# Patient Record
Sex: Male | Born: 1965 | Race: White
Health system: Northeastern US, Academic
[De-identification: ages and names within clinical notes are randomized; demographics above are authoritative.]

## PROBLEM LIST (undated history)

## (undated) DIAGNOSIS — K439 Ventral hernia without obstruction or gangrene: Secondary | ICD-10-CM

## (undated) DIAGNOSIS — A281 Cat-scratch disease: Secondary | ICD-10-CM

## (undated) DIAGNOSIS — F40240 Claustrophobia: Secondary | ICD-10-CM

## (undated) DIAGNOSIS — Z789 Other specified health status: Secondary | ICD-10-CM

## (undated) DIAGNOSIS — T4145XA Adverse effect of unspecified anesthetic, initial encounter: Secondary | ICD-10-CM

## (undated) DIAGNOSIS — T8859XA Other complications of anesthesia, initial encounter: Secondary | ICD-10-CM

## (undated) DIAGNOSIS — Z8489 Family history of other specified conditions: Secondary | ICD-10-CM

## (undated) HISTORY — PX: CHOLECYSTECTOMY: SHX55

## (undated) HISTORY — PX: LAPAROSCOPIC CHOLECYSTECTOMY: SUR755

## (undated) HISTORY — PX: NECK SURGERY: SHX720

## (undated) HISTORY — PX: HERNIA REPAIR: SHX51

## (undated) HISTORY — PX: CHOLECYSTECTOMY, LAPAROSCOPIC: SHX56

## (undated) HISTORY — PX: THYROID SURGERY: SHX805

---

## 2002-07-08 ENCOUNTER — Encounter: Payer: Self-pay | Admitting: Neurosurgery

## 2002-07-08 ENCOUNTER — Encounter: Payer: Self-pay | Admitting: Radiology

## 2002-07-08 ENCOUNTER — Encounter: Admission: RE | Admit: 2002-07-08 | Discharge: 2002-07-08 | Payer: Self-pay | Admitting: Neurosurgery

## 2002-07-22 ENCOUNTER — Encounter: Payer: Self-pay | Admitting: Neurosurgery

## 2002-07-22 ENCOUNTER — Encounter: Admission: RE | Admit: 2002-07-22 | Discharge: 2002-07-22 | Payer: Self-pay | Admitting: Neurosurgery

## 2002-11-11 ENCOUNTER — Encounter: Payer: Self-pay | Admitting: Neurosurgery

## 2002-11-11 ENCOUNTER — Encounter: Admission: RE | Admit: 2002-11-11 | Discharge: 2002-11-11 | Payer: Self-pay | Admitting: Neurosurgery

## 2003-05-05 ENCOUNTER — Encounter: Admission: RE | Admit: 2003-05-05 | Discharge: 2003-05-05 | Payer: Self-pay | Admitting: Neurosurgery

## 2004-03-30 ENCOUNTER — Encounter: Admission: RE | Admit: 2004-03-30 | Discharge: 2004-03-30 | Payer: Self-pay | Admitting: Neurosurgery

## 2009-09-07 ENCOUNTER — Encounter: Payer: Self-pay | Admitting: Primary Care

## 2009-09-07 ENCOUNTER — Encounter: Payer: Self-pay | Admitting: Gastroenterology

## 2009-09-14 ENCOUNTER — Ambulatory Visit: Payer: Self-pay | Admitting: Primary Care

## 2009-09-14 ENCOUNTER — Ambulatory Visit
Admit: 2009-09-14 | Discharge: 2009-09-14 | Disposition: A | Discharge status: Home / Self Care | Payer: Self-pay | Source: Ambulatory Visit | Attending: Primary Care | Admitting: Primary Care

## 2009-09-14 DIAGNOSIS — M722 Plantar fascial fibromatosis: Secondary | ICD-10-CM | POA: Insufficient documentation

## 2009-09-14 LAB — CBC AND DIFFERENTIAL
Baso # K/uL: 0 THOU/uL (ref 0.0–0.1)
Basophil %: 0.2 % (ref 0.2–1.2)
Eos # K/uL: 0.3 THOU/uL (ref 0.0–0.5)
Eosinophil %: 4.8 % (ref 0.8–7.0)
Hematocrit: 43 % (ref 40–51)
Hemoglobin: 15 g/dL (ref 13.7–17.5)
Lymph # K/uL: 2.4 THOU/uL (ref 1.3–3.6)
Lymphocyte %: 41.8 % (ref 21.8–53.1)
MCV: 88 fL (ref 79–92)
Mono # K/uL: 0.4 THOU/uL (ref 0.3–0.8)
Monocyte %: 6.5 % (ref 5.3–12.2)
Neut # K/uL: 2.6 THOU/uL (ref 1.8–5.4)
Platelets: 185 THOU/uL (ref 150–330)
RBC: 4.9 MIL/uL (ref 4.6–6.1)
RDW: 12.2 % (ref 11.6–14.4)
Seg Neut %: 46.7 % (ref 34.0–67.9)
WBC: 5.7 THOU/uL (ref 4.2–9.1)

## 2009-09-14 LAB — COMPREHENSIVE METABOLIC PANEL
ALT: 33 U/L (ref 0–50)
AST: 22 U/L (ref 0–50)
Albumin: 4.6 g/dL (ref 3.5–5.2)
Alk Phos: 54 U/L (ref 40–130)
Anion Gap: 12 (ref 7–16)
Bilirubin,Total: 1 mg/dL (ref 0.0–1.2)
CO2: 26 mmol/L (ref 20–28)
Calcium: 9.2 mg/dL (ref 9.0–10.3)
Chloride: 105 mmol/L (ref 96–108)
Creatinine: 1.07 mg/dL (ref 0.67–1.17)
GFR,Black: >59 *
GFR,Caucasian: >59 *
Glucose: 93 mg/dL (ref 74–106)
Lab: 24 mg/dL — ABNORMAL HIGH (ref 6–20)
Potassium: 4.6 mmol/L (ref 3.3–5.1)
Sodium: 143 mmol/L (ref 133–145)
Total Protein: 7.3 g/dL (ref 6.3–7.7)

## 2009-09-14 LAB — LIPID PANEL
Chol/HDL Ratio: 3.4
Cholesterol: 178 mg/dL
HDL: 52 mg/dL
LDL Calculated: 104 mg/dL
Non HDL Cholesterol: 126 mg/dL
Triglycerides: 111 mg/dL

## 2009-09-14 NOTE — H&P (Signed)
Allergies   No Known Drug Allergy.  Current Meds   Naproxen 500 MG Tablet;TAKE 1 TABLET TWICE DAILY AS NEEDED.; RPT.  Active Problems   Plantar Fasciitis; Right (728.71).  PSH   Cholecystectomy Laparoscopic 14 Oct 2008.  Family Hx   Patient is adopted.  Personal Hx   Patient is engaged to be married.  He does not smoke and drinks alcohol   socially.  He works in Education officer, environmental at [employer].  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  ROS   HEAD:  no headache, dizziness, or syncope  EYES:  no visual changes  EARS, NOSE, THROAT:  no hearing loss, tinnitus, or vertigo;  no epistaxis   or rhinitis;  no hoarsness or dysphagia;  no bleeding gums.  CARDIOPULMONARY:  no  chest pain, has occasional palpitations, no SOB;  no   cough, hemoptysis, dyspnea, or orthopnea.  GASTROINTESTINAL:  no loss of appetite; no nausea or vomiting; no   heartburn;  no diarrhea or constipation;   no BRBPR, melena, or bowel shape   change.  MEN:  no urinary difficulties; no penile discharge or sores; no testicle   pain or mass; no hernia; no erectile dysfunction; no breast   mass/tenderness; no nipple discharge.  TSE:  yes.  ENDOCRINE:  no hypo/hyper thyroid symptoms; no diabetes symptoms.  MUSCULOSKELETAL: Has had fairly severe right plantar fasciitis for the past   4 months for which he is seeing a podiatrist; no weakness; no back pain.  NEURO-PSYCH:  no numbness or tingling; no depression or anxiety.  HEME-ONC:  no bruising; no bleeding; no fevers/chills or night sweats.  No   weight loss.  DERM:  no mole changes; no rashes  LYMPH:  no lymphadenopathy or edema.  HEALTH MAINTENANCE:    --Alcohol:  yes, socially  --Smoking:  no  --Drug Use:  no  --Seat belts:  yes  --Caffeine:  yes  --Salt:  yes  --Exercise:  yes  --Safe Sex:  yes  --Calcium:  no  --Proxy:  no  --Cholesterol:  [  ].  Vital Signs   Recorded by Green,Heather on 14 Sep 2009 07:17 AM  BP:118/82,   HR: 64 b/min,   Height: 67 in, Weight: 200 lb, BMI: 31.3  kg/m2.  Physical Exam   GENERAL:  Alert and oriented; no acute distress  HEAD:  normal  EYES:   --lids:  normal  --conjunctiva:  normal  --pupils:  normal  --sclera:  normal  --EOM:  normal  EARS, NOSE, THROAT:  --ext. ear:  normal  --T.M.'s:  normal  --hearing:  normal  --nares:  normal  --teeth:  normal  --gums:  normal  --tonsils:  normal  --throat:  normal  NECK:  --ROM:  normal  --nodes:  negative  --carotids: normal  --bruits:  none heard   --thyroid:  normal  HEART:  RRR, no m/g/r  LUNGS:  clear bilaterally  CHEST:  normal  BREASTS:    --skin:  normal  --masses:  none  --nipples:  normal  --discharge:  none  --nodes:  none  BACK:  normal  SKIN:  normal  LYMPH:  no nodes  ABDOMEN:    --softness:  normal  --tenderness:  non-tender  --distension:  none  --organs:  no HSM  --bruits:  none  --pulsatile mass:  none  RECTAL:    --tone:  normal  --hemorrhoids:  none  --stool:  normal color  --guaiac:  negative  --masses:  none  --  prostate:  normal  GENITALIA:  male  --hernia:  none present  --penis:  no lesions, circumcised  --scrotum:  normal  --testes:  descended; no masses; non-tender  EXTREMITIES:  --edema:  none  --pulses:  normal  --joints:  normal  --nails:   normal    NEUROLOGIC:    --cranial nerves II-XII:  normal  --motor:  normal  --sensory:  normal  --cerebellar:  normal  --reflexes:  normal   --Babinski:  normal.  Results   ECG: Normal  UA: Patient unable to give sample.  Assessment   1. counseled on proper diet and exercise  2. guaiac cards x2 given  3. he continues to have pain in his right foot from plantar fasciitis.  He   will continue to followup with the podiatrist for this.  4. he will check on when his last tetanus shot was and call if he is due.  Plan   Check 12, CBC, and cholesterol profile today  Followup p.r.n.  Signature   Electronically signed by: Jacklyn Shell  M.D.; 09/14/2009 7:56 AM EST.

## 2009-09-15 ENCOUNTER — Encounter: Payer: Self-pay | Admitting: Primary Care

## 2009-11-16 ENCOUNTER — Ambulatory Visit: Payer: Self-pay | Admitting: Primary Care

## 2009-11-16 NOTE — Progress Notes (Signed)
 SOAP          Subjective: Patient is here to followup his recent ED visit to [facility]   for an epigastric and substernal chest pain.  He states he was at work 3   days ago when he suddenly developed a sharp severe epigastric and   substernal chest pain.  The pain went across his upper abdomen and lower   chest.  It did not radiate to his back.  He did not have any shortness of   breath, nausea, vomiting, or abdominal distention with it.  In the ED he   had a normal chest x-ray, O2 sat, ECG, and abdominal CT scan.  His pain was   reproducible with movement so they felt it was likely a costochondritis.    He was ruled out for an MI.  He was given pain medicines and subsequently   discharged.  Since discharge he complains of persistent pain on both sides   of his chest but it is better.  The pain is worse with movements or taking   a deep breath.  He does not radiate to his back, neck, or arms.  Symptoms   are not related to exertion.  He denies any nausea, vomiting, abdominal   distention, diarrhea, constipation, fevers, or chills.  I reviewed his   medication list and allergies with him.     Physical exam:   Mouth: Mucosa moist   Neck: No adenopathy or bruits   Lungs: Clear   No CVA tenderness               Chest: Tender to palpation across lower chest wall anteriorly   and bilaterally   Cardiac exam: RRR,  No M/R/G   Abdomen: Soft, nontender, non-distended, no hepato- splenomegaly, bowel                                    sounds normal,   no masses                                       Extremities:  No C/C/E   Pulses: 2+          Assessment and Plan:  Etiology of patient's chest and upper abdominal discomfort is unclear but   appears most consistent with costochondritis.  Extensive workup in the ED   was negative for any abdominal process, cardiac ischemia.  PE is also   unlikely given his normal O2 saturation and the fact that symptoms are   easily reproducible with movement.  Since his workup was normal and    symptoms are improving, will treat conservatively.  Will try tramadol for   pain.  I explained this this could take one to 2 weeks to resolve.  He will   call if symptoms worsen or do not continue to improve.        .  Active Problems   Plantar Fasciitis; Right (728.71).  Current Meds   Naproxen 500 MG Tablet;TAKE 1 TABLET TWICE DAILY AS NEEDED.; RPT  TraMADol HCl 50 MG Tablet;TAKE  TABLET EVERY 6 HOURS PRN; Rx.  Allergies   No Known Drug Allergy.  Vital Signs   Recorded by Dorschel,Pamela on 16 Nov 2009 11:39 AM  BP:120/86,   Height: 67 in, Weight: 201.8 lb, BMI: 31.6 kg/m2.  Signature  Electronically signed by: Jacklyn Shell  M.D.; 11/16/2009 1:10 PM EST.

## 2010-02-22 NOTE — Miscellaneous (Unsigned)
 Continuity of Care Record  Created: todo  From: ,   From:   From: [employer], EHR v10.2.7.53  To: Pringle, Theoplis  Purpose: Patient Use;       Problems  Diagnosis: Plantar Fasciitis; Right (728.71)     Alerts  Allergy - No Known Drug Allergy     Medications  Amoxicillin-Pot Clavulanate 875-125 MG Tablet; TAKE 1 TABLET EVERY 12 HOURS   DAILY. ; Rx   Cyclobenzaprine HCl 10 MG Tablet; take 1 tablet by mouth three times a day   if needed ; RPT   Naproxen 500 MG Tablet; TAKE 1 TABLET TWICE DAILY AS NEEDED. ; RPT   Non-medication order(s); Patient was out of work from 11-16-2009 to   11-22-2009 due to illness. ; Rx   TraMADol HCl 50 MG Tablet; TAKE  TABLET EVERY 6 HOURS PRN ; Rx

## 2010-05-03 ENCOUNTER — Ambulatory Visit: Payer: Self-pay | Admitting: Primary Care

## 2010-05-03 ENCOUNTER — Encounter: Payer: Self-pay | Admitting: Primary Care

## 2010-05-03 NOTE — Progress Notes (Signed)
 SOAP          Subjective: Patient has had a small cyst on his upper mid back for a few   years.  Now over the past month it has gotten larger and painful.  He   states the past couple days it is much worse.  He denies any drainage from   the area.  He also denies any fevers or chills.     Physical exam:        Skin: 6 x 6 cm erythematous fluctuant epidermal cyst.  It is not   draining.     Assessment and Plan:  Patient with an infected epidermal cyst.  Will refer to dermatology for   removal.  In the meantime will start Augmentin 875 mg twice daily for 10   days.  Use ibuprofen p.r.n.           .  Active Problems   Plantar Fasciitis; Right (728.71).  Current Meds   Naproxen 500 MG Tablet;TAKE 1 TABLET TWICE DAILY AS NEEDED.; RPT  TraMADol HCl 50 MG Tablet;TAKE  TABLET EVERY 6 HOURS PRN; Rx  Non-medication order(s);Patient was out of work from 11-16-2009 to   11-22-2009 due to illness.; Rx  Cyclobenzaprine HCl 10 MG Tablet;take 1 tablet by mouth three times a day   if needed; RPT  Amoxicillin-Pot Clavulanate 875-125 MG Tablet;TAKE 1 TABLET EVERY 12 HOURS   DAILY.; Rx.  Allergies   No Known Drug Allergy.  Vital Signs   Recorded by Dorschel,Pamela on 03 May 2010 10:00 AM  BP:118/68,   Height: 67 in, Weight: 179 lb, BMI: 28 kg/m2.  Signature   Electronically signed by: Jacklyn Shell  M.D.; 05/03/2010 10:15 AM EST.

## 2010-05-06 ENCOUNTER — Ambulatory Visit: Payer: Self-pay | Admitting: Surgery

## 2010-05-13 ENCOUNTER — Ambulatory Visit: Payer: Self-pay | Admitting: Surgery

## 2010-05-16 ENCOUNTER — Ambulatory Visit: Admit: 2010-05-16 | Payer: Self-pay | Source: Ambulatory Visit | Admitting: Surgery

## 2010-12-20 ENCOUNTER — Ambulatory Visit: Payer: Self-pay | Admitting: Orthopedic Surgery

## 2012-04-13 ENCOUNTER — Telehealth: Payer: Self-pay | Admitting: Primary Care

## 2012-04-13 NOTE — Telephone Encounter (Signed)
Called with 1 week of harsh dry cough and now off and on fever and some diarrhea. Possible pertussis or atypical flu. Advised urgent care eval, he does not wish cough syrup or antibiotics. He has a 45 yr old a home so advised check up and testing

## 2012-04-19 ENCOUNTER — Ambulatory Visit: Payer: Self-pay | Admitting: Primary Care

## 2012-04-19 ENCOUNTER — Encounter: Payer: Self-pay | Admitting: Primary Care

## 2012-04-19 VITALS — BP 126/70 | Ht 66.0 in | Wt 207.0 lb

## 2012-04-19 DIAGNOSIS — Z299 Encounter for prophylactic measures, unspecified: Secondary | ICD-10-CM

## 2012-04-19 DIAGNOSIS — Z0000 Encounter for general adult medical examination without abnormal findings: Secondary | ICD-10-CM

## 2012-04-19 LAB — POCT URINALYSIS DIPSTICK
Blood,UA POCT: NEGATIVE
Glucose,UA POCT: NORMAL
Ketones,UA POCT: NEGATIVE
Leuk Esterase,UA POCT: NEGATIVE
Lot #: 22078502
Nitrite,UA POCT: NEGATIVE
PH,UA POCT: 5 (ref 5–8)
Protein,UA POCT: NEGATIVE mg/dL
Specific gravity,UA POCT: 1.02 (ref 1.002–1.03)

## 2012-04-19 NOTE — Addendum Note (Signed)
Addended by: Laural Benes on: 04/19/2012 02:10 PM     Modules accepted: Orders

## 2012-04-19 NOTE — H&P (Signed)
REVIEW OF SYSTEMS:    HEAD:  no headache, dizziness, or syncope  EYES:  no visual changes  EARS, NOSE, THROAT:  no hearing loss, tinnitus, or vertigo;  no epistaxis or rhinitis;  no hoarsness or dysphagia;  no bleeding gums.  CARDIOPULMONARY:  no  chest pain, palpitations, or SOB;  no cough, hemoptysis, dyspnea, or orthopnea.  GASTROINTESTINAL:  no loss of appetite; no nausea or vomiting; no heartburn;  no diarrhea or constipation;   no BRBPR, melena, or bowel shape change. He does complain of some pain in his left groin and lower abdominal area  MEN:  no urinary difficulties; no penile discharge or sores; no testicle pain or mass; no hernia; no erectile dysfunction; no breast mass/tenderness; no nipple discharge.  ENDOCRINE:  no hypo/hyper thyroid symptoms; no diabetes symptoms.  MUSCULOSKELETAL:  no painful or swollen joints; no weakness; no back pain.  NEURO-PSYCH:  no numbness or tingling; no depression or anxiety.  HEME-ONC:  no bruising; no bleeding; no fevers/chills or night sweats.  No weight loss.  DERM:  no mole changes; no rashes  LYMPH:  no lymphadenopathy or edema    BP 126/70  Ht 1.676 m (5\' 6" )  Wt 93.895 kg (207 lb)  BMI 33.43 kg/m2    General Appearance:    Alert, cooperative, no distress, appears stated age   Head:    Normocephalic, without obvious abnormality, atraumatic   Eyes:    PERRL, conjunctiva/corneas clear, EOM's intact, fundi     benign, both eyes        Ears:    Normal TM's and external ear canals, both ears   Nose:   Nares normal, septum midline, mucosa normal, no drainage    or sinus tenderness   Throat:   Lips, mucosa, and tongue normal; teeth and gums normal   Neck:   Supple, symmetrical, trachea midline, no adenopathy;        thyroid:  No enlargement/tenderness/nodules; no carotid    bruit or JVD   Back:     Symmetric, no curvature, ROM normal, no CVA tenderness   Lungs:     Clear to auscultation bilaterally, respirations unlabored   Chest wall:    No tenderness or deformity    Heart:    Regular rate and rhythm, S1 and S2 normal, no murmur, rub   or gallop   Abdomen:     Soft, non-tender, bowel sounds active all four quadrants,     no masses, no organomegaly.  Small umbilical hernia   Genitalia:    Normal male without lesion, discharge or tenderness   Rectal:    Normal tone, normal prostate, no masses or tenderness;    guaiac negative stool   Extremities:   Extremities normal, atraumatic, no cyanosis or edema   Pulses:   2+ and symmetric all extremities   Skin:   Skin color, texture, turgor normal, no rashes or lesions   Lymph nodes:   Cervical, supraclavicular, and axillary nodes normal   Neurologic:   CNII-XII intact. Normal strength, sensation and reflexes       Throughout    Results:    1. ECG: No acute changes    Assessment:    1. Counseled on proper diet, exercise, and need for weight loss  2. Plantar fasciitis symptoms have not been bothersome so follow.  3. He just finished a course of Zithromax for a bronchial infection.  Will follow for now.  4. The discomfort in his left groin area is likely  a muscle strain or nerve irritation.  No masses were palpable and he has no hernia.  Will follow for now.  He will call if it does not improve.  5. Umbilical hernia is small and not bothersome so follow.    Plan:    1. Check 12, CBC, and cholesterol profile

## 2012-04-19 NOTE — Patient Instructions (Signed)
Continue to work on diet and exercise

## 2012-09-17 ENCOUNTER — Ambulatory Visit: Payer: Self-pay | Admitting: Orthopedic Surgery

## 2012-09-17 ENCOUNTER — Encounter: Payer: Self-pay | Admitting: Orthopedic Surgery

## 2012-09-17 VITALS — BP 113/84 | Ht 66.0 in | Wt 200.0 lb

## 2012-09-17 DIAGNOSIS — M19079 Primary osteoarthritis, unspecified ankle and foot: Secondary | ICD-10-CM

## 2012-09-18 NOTE — Progress Notes (Signed)
Chief complaint: Right heel pain of 2-3 years duration    History: Right foot plantar medial heel pain with some lateral ankle pain since at least December of 2010.  Patient has done some stretching exercises, use the night splint, and used inserts as well as had 2 steroid injections and used to fracture type boot but these have not adequately help and he has pain on a daily basis up to 7/10 pain worse after increased activity.  States the pain is significantly affecting his life.    Patient's past medical history, medications, allergies, surgical history, family history, and social history have all been reviewed and confirmed in the electronic record.    The following systems were reviewed:  Gastrointestinal: Frequent indigestion, blood in stools, heartburn, colitis, ulcer, nausea  Urinary: Kidney stones, difficult urination, frequent urination, burning, painful  Neurologic: Paralysis, tingling in arms or legs, weakness, seizures, numbness, tremor  Integumentary: Frequent rashes, boils, infection, itching, delayed wound healing  Vascular: Vein problems, phlebitis, blood clots, calf pain, bleeding difficulty, easy bruising  Cardiac: Chest pain, irregular heartbeat, heart murmur, shortness of breath  Pulmonary: Shortness of breath, wheezing, chronic cough, difficulty breathing lying flat  Endocrine: Excessive sweating, excessive thirst, masses or swelling  Constitutional: Fevers, chills, night sweats, unintentional weight loss or gain, appetite loss  Musculoskeletal: Multiple joint aches or swelling, fibromyalgia, joint instability  Hematologic: Easy bruising, swelling or masses  Psychiatric: Depression, anxiety, bipolar disorder, schizophrenia  Positives noted include: Denies except as noted above    Examination:Patient is awake alert and fully oriented.  Patient is pleasant and cooperative.  Patient is well-developed and well-nourished.  Patient is in no apparent distress.  Vital signs reviewed in electronic  record.  Bilateral foot and ankle examination reveals cavus feet mild hindfoot varus.  On the right side tenderness over the plantar medial heel and especially over the peroneals where there is some crepitus but no frank subluxation.  On the left side the tendons are more stable.  Otherwise he has gastrocnemius tightness bilaterally but no evidence of instability pulses are palpable sensation is intact to light touch and he has normal motor function    Imaging studies: Radiograph show minimal spurring no evidence of a stress fracture or subtalar arthrosis cavus foot    Impression: Recalcitrant right plantar fasciitis with a cavus foot slight heel varus a tight gastrocnemius.  There is also question of peroneal tendon subluxation likely these arm he coming more aggravated secondary to abnormal gait    Recommendations: Spenco inserts with lateral wedges and physical therapy rationale discussed followup in 3 months

## 2012-12-17 ENCOUNTER — Ambulatory Visit: Payer: Self-pay | Admitting: Orthopedic Surgery

## 2013-09-09 ENCOUNTER — Ambulatory Visit: Payer: Self-pay | Admitting: Primary Care

## 2014-01-25 ENCOUNTER — Ambulatory Visit
Admit: 2014-01-25 | Discharge: 2014-01-25 | Disposition: A | Discharge status: Home / Self Care | Payer: Self-pay | Attending: Emergency Medicine | Admitting: Emergency Medicine

## 2014-01-25 DIAGNOSIS — L500 Allergic urticaria: Secondary | ICD-10-CM

## 2014-01-25 LAB — HM HIV SCREENING OFFERED

## 2014-01-25 MED ORDER — DEXAMETHASONE SODIUM PHOSPHATE 10 MG/ML IJ SOLN *I*
10.0000 mg | Freq: Once | INTRAMUSCULAR | Status: DC
Start: 2014-01-25 — End: 2014-01-25

## 2014-01-25 MED ORDER — DEXAMETHASONE SODIUM PHOSPHATE 10 MG/ML IJ SOLN *I*
10.0000 mg | Freq: Once | INTRAMUSCULAR | Status: AC
Start: 2014-01-25 — End: 2014-01-25
  Administered 2014-01-25: 10 mg via INTRAMUSCULAR

## 2014-01-25 MED ORDER — METHYLPREDNISOLONE ACETATE 40 MG/ML IJ SUSP *I*
80.0000 mg | Freq: Once | INTRAMUSCULAR | Status: AC
Start: 2014-01-25 — End: 2014-01-25
  Administered 2014-01-25: 80 mg via INTRAMUSCULAR

## 2014-01-25 NOTE — UC Provider Note (Signed)
History     Chief Complaint   Patient presents with    Rash     developed rash over entire body since last evening. On doxycycline x 5 days and prednisone dose today. Denies SOB. Took claritin at 1000 today     HPI Comments: CC: HIVES  HPI: yesterday developed itchy hives all over body, worse today. No relief with claritin and prednisone 40 mg. Pt started taking doxy 5 days ago for bronchitis. States this has happened to him randomly in past but never this bad. Pt denies relation to heat or cold. No New foods or other new meds. Cannot take benadryl d/t side effects. Pt has never been able to relate to substance. Pt was hunting all day yesterday and got deer which he butchered himself. Has done this many times without adverse rxn. Did not get bit by anything. Most distressed about itching. No oral swelling, sob, syncope.     Patient is a 48 y.o. male presenting with urticaria.   History provided by:  Patient and spouse (pt's spouse is physician)  Language interpreter used: No    Is this ED visit related to civilian activity for income:  Not work related  Urticaria  Associated symptoms: no diarrhea, no fever, no nausea, no sore throat and not vomiting        History reviewed. No pertinent past medical history.         Past Surgical History   Procedure Laterality Date    Cholecystectomy, laparoscopic       Cholecystectomy Laparoscopic Conversion Data        Family History   Problem Relation Age of Onset    Adopted: Yes    Family history unknown: Yes         Social History      reports that he has never smoked. He has never used smokeless tobacco. He reports that he drinks alcohol. He reports that he does not use illicit drugs. His sexual activity history is not on file.    Living Situation     Questions Responses    Patient lives with Family    Homeless No    Caregiver for other family member No    External Services None    Employment Employed    Domestic Violence Risk           Review of Systems   Review of  Systems   Constitutional: Negative.  Negative for fever.   HENT: Negative for congestion and sore throat.    Eyes: Negative for pain.   Respiratory: Negative for cough.    Cardiovascular: Negative for chest pain.   Gastrointestinal: Negative for nausea, vomiting and diarrhea.   Genitourinary: Negative for dysuria.   Musculoskeletal: Negative for back pain.   Skin: Positive for rash.   Neurological: Negative.    Psychiatric/Behavioral: Negative.        Physical Exam     ED Triage Vitals   BP Pulse Heart Rate (via Pulse Ox) Resp Temp Temp src SpO2 O2 Device O2 Flow Rate   01/25/14 1847 -- 01/25/14 1847 01/25/14 1847 01/25/14 1847 01/25/14 1847 01/25/14 1847 -- --   127/83 mmHg  87 16 36.1 C (96.9 F) Oral 95 %        Weight           01/25/14 1847           90.719 kg (200 lb)  Physical Exam   Constitutional: He is oriented to person, place, and time. He appears well-developed and well-nourished.   HENT:   Head: Normocephalic and atraumatic.   Mouth/Throat: Oropharynx is clear and moist.   Eyes: Conjunctivae are normal.   Neck: Neck supple.   Cardiovascular: Normal rate and regular rhythm.    Pulmonary/Chest: No respiratory distress.   Abdominal: Soft. There is no tenderness.   Musculoskeletal: Normal range of motion.   Lymphadenopathy:     He has no cervical adenopathy.   Neurological: He is alert and oriented to person, place, and time.   Skin: Skin is warm and dry. Rash (hives all extremities and trunk. head from neck up is beet red. ) noted.   Psychiatric: He has a normal mood and affect. His behavior is normal.   Nursing note and vitals reviewed.      Medical Decision Making        Initial Evaluation:  ED First Provider Contact     Date/Time Event User Comments    01/25/14 1858 ED Provider First Contact Jailee Jaquez, Eston MouldRACEY QUAIL Initial Face to Face Provider Contact          Patient seen by me as above    Assessment:  48 y.o., male [facility] with hives of uncertain  etiology    Differential Diagnosis includes allergic urticaria, idiopathic vs doxy related.               Plan: continue claritin, IM depo/dexa given. Can restart prednisone if still present in a few days. Pt can f/u with pcp will likely need allergist.      Caroline Moreracey Q Meiah Zamudio, MD

## 2014-01-25 NOTE — ED Notes (Signed)
developed rash over entire body since last evening. On doxycycline x 5 days and prednisone dose today. Denies SOB. Took claritin at 1000 today

## 2014-03-24 ENCOUNTER — Encounter: Payer: Self-pay | Admitting: Gastroenterology

## 2014-03-24 ENCOUNTER — Encounter: Payer: Self-pay | Admitting: Primary Care

## 2014-03-24 ENCOUNTER — Ambulatory Visit: Payer: Self-pay | Admitting: Primary Care

## 2014-03-24 VITALS — BP 110/70 | HR 86 | Resp 16 | Ht 66.93 in | Wt 214.0 lb

## 2014-03-24 DIAGNOSIS — J40 Bronchitis, not specified as acute or chronic: Secondary | ICD-10-CM

## 2014-03-24 DIAGNOSIS — R059 Cough, unspecified: Secondary | ICD-10-CM

## 2014-03-24 MED ORDER — LEVOFLOXACIN 500 MG PO TABS *I*
500.0000 mg | ORAL_TABLET | Freq: Every day | ORAL | Status: DC
Start: 2014-03-24 — End: 2014-05-25

## 2014-03-24 MED ORDER — FLUTICASONE-SALMETEROL 250-50 MCG/ACT IN AEPB *I*
1.0000 | INHALATION_SPRAY | Freq: Two times a day (BID) | RESPIRATORY_TRACT | Status: AC
Start: 2014-03-24 — End: 2014-09-20

## 2014-03-24 NOTE — Progress Notes (Signed)
Subjective: patient developed a productive cough almost 3 months ago.  Initially he was treated with doxycycline and prednisone.  His symptoms improved a little but never went away.  In December he suddenly developed hives and went to urgent care.  He was treated with prednisone at that point and states the cough got much better.  It still never went away and over the past month he continues to have a heart hacking cough at times.  He will bring up green phlegm.  He denies any fevers or chills.  He will feel slightly short of breath at times.  He denies having any indigestion or reflux symptoms with this.  I reviewed his medication list and allergies with him.    Physical exam:   Ears: Normal canals and TMs   Pharynx: Non-injected   Neck:  No adenopathy   Sinus tenderness: None   Nasal turbinates: Boggy with clear discharge   Lungs: Clear     Assessment and Plan:  Etiology of patient's symptoms is unclear.  He could have a prolonged viral bronchitis or possible ongoing tracheobronchitis. He denies any reflux symptoms so this would be less likely a cause.  Given ongoing symptoms, will check a chest x-ray today.  Will start Levaquin 500 mg once daily for 10 days.  Will also start Advair 250/50 twice daily.  He will call if symptoms do not improve as I would either consider medication for reflux or refer to pulmonary for consult.

## 2014-03-24 NOTE — Patient Instructions (Signed)
Call if symptoms don't improve

## 2014-03-25 ENCOUNTER — Telehealth: Payer: Self-pay | Admitting: Primary Care

## 2014-03-25 NOTE — Telephone Encounter (Signed)
Spoke with patient and gave NL chest xray result per PCP

## 2014-05-25 ENCOUNTER — Ambulatory Visit: Payer: Self-pay | Admitting: Surgery

## 2014-05-25 ENCOUNTER — Encounter: Payer: Self-pay | Admitting: Surgery

## 2014-05-25 VITALS — BP 130/86 | HR 77 | Temp 97.2°F | Resp 18 | Ht 66.0 in | Wt 215.0 lb

## 2014-05-25 DIAGNOSIS — K439 Ventral hernia without obstruction or gangrene: Secondary | ICD-10-CM | POA: Insufficient documentation

## 2014-05-25 NOTE — Preop H&P (Signed)
Subjective:     Raymond Maxwell is a 49 y.o. male who presents at the request of [name], [place], MD  for evaluation of ventral hernia. Symptoms were first noted 3 years ago.  Pain is intermittent. Lump is not reducible. Symptoms did not start at work. Patient has no symptoms of  chronic constipation, chronic cough, difficulty urinating. Patient does not have previous hx of hernia surgery.  Drives school bus.  Pain and swelling worse this past weekend.     Patient Active Problem List    Diagnosis Date Noted    Ventral hernia without obstruction or gangrene 05/25/2014    Plantar Fasciitis 09/14/2009     Created by Conversion         History reviewed. No pertinent past medical history.   Past Surgical History   Procedure Laterality Date    Cholecystectomy, laparoscopic       Cholecystectomy Laparoscopic Conversion Data         (Not in a hospital admission)  Allergies   Allergen Reactions    Penicillins Hives    Doxycycline Hives    No Known Drug Allergy      Created by Conversion - 0;       History   Substance Use Topics    Smoking status: Never Smoker     Smokeless tobacco: Never Used    Alcohol Use: Yes      Comment: socially      Family History   Problem Relation Age of Onset    Adopted: Yes    Substance abuse Neg Hx         Review of Systems  A comprehensive review of systems was negative       Objective:     BP 130/86 mmHg   Pulse 77   Temp(Src) 36.2 C (97.2 F)   Resp 18   Ht 1.676 m (5\' 6" )   Wt 97.523 kg (215 lb)   BMI 34.72 kg/m2   SpO2 97%    Gen: Alert and oriented. No acute distress.  Cardiac: Regular rate and rhythm  Abdomen: Soft. Non-distended. Non-tender.  Hernia is palpable without Valsalva.  Hernia is notreducible. Size approximately 6 cm.   Extremities: No peripheral edema noted bilaterally      Data Review:     Lab Results   Component Value Date    WBC 5.7 09/14/2009    HGB 15.0 09/14/2009    HCT 43 09/14/2009    MCV 88 09/14/2009    PLT 185 09/14/2009     No results for input(s): NA, K, CL,  CO2, UN, CREAT, GFRC, GFRB, GLU, CA in the last 8760 hours.    No results found for: INR, PTI    Imaging:      No results found.        Assessment:     1. Ventral hernia without obstruction or gangrene           Plan:      1. Discussed possibility of incarceration, strangulation, enlargement in size over time, and the risk of emergency surgery in the face of strangulation.  Also discussed the risk of surgery including recurrence, use of prosthetic materials (mesh) and the risk of infection, and the possible need for re-operation and removal of mesh if used, along with possibility of post-op SBO or ileus.  Risks of neuralgia discussed.  The patient understands the risks, any and all questions were answered to the patient's satisfaction.  2. Patient has elected to  proceed with surgical treatment. Procedure will be scheduled.  Written consent was obtained. Open procedure.

## 2014-05-26 NOTE — Plan of Care (Signed)
Problem: Knowledge deficit related to pre or post-op regimens  Goal: Patient verbalizes understanding of PACU teaching  Outcome: Completed or Resolved Date Met:  05/26/14

## 2014-05-26 NOTE — Telephone (Signed)
Pre-Operative Instructions Record                    HH [id number] MR    PRIOR TO SURGERY    Five days before surgery, 4/15, you must STOP all aspirin, ibuprofen (Advil, Naproxen, Aleve, Motrin, etc.) and all vitamins and herbal supplements. YOU MAY TAKE ACETAMINOPHEN (TYLENOL).    FOLLOW YOUR SURGEON'S INSTRUCTIONS IF DIFFERENT THAN ABOVE.    DAY BEFORE SURGERY - 4/19    Call [phone] between 1PM and 4PM and select option #1 to receive your arrival/surgery time.        Do not eat anything (including candy or gum) after midnight the night before your surgery.  ____________________________________________________________________________    DAY OF SURGERY - 4/20    Up to 4 hours before your surgery time, clear liquids are allowed (unless your doctor tells you differently).  Examples: black coffee or tea (no dairy or non-dairy creamer), soda, water, clear apple or cranberry juice.  No orange or tomato juice.     MEDICATIONS   PLEASE REFER TO THE MEDICATION LIST ON THE ATTACHED PAGE AND ONLY TAKE THE MEDICATIONS MARKED ON THAT LIST.   DO NOT TAKE ANY MEDICATIONS THAT WERE ALREADY STOPPED (ABOVE).   Bring and use inhalers as needed.   Pain and anxiety medications may be taken with a sip of water at any time.    DO NOT WEAR ANY RINGS, JEWELRY, BODY LOTION OR SCENTS.  You may brush your teeth and use deodorant.  If wearing eyeglasses, please bring a case.  DO NOT WEAR CONTACT LENSES.  ________________________________________________________________________________    AT THE HOSPITAL  Park in the Main Ramp garage.  Report to [facility] on [address].  Leave your belongings in the car and your visitors can bring them to your room after surgery.    Any questions? Call [phone], select option 1, and ask to speak with a nurse or call your surgeon.    The patient has participated in the development of this  discharge plan and the above material has been reviewed.  Questions have been answered and he/she understands the contents of this plan.  Randie HeinzAmanda L Taite Schoeppner, RN 05/26/2014 8:55 AM

## 2014-06-03 ENCOUNTER — Encounter: Disposition: A | Discharge status: Home / Self Care | Payer: Self-pay | Source: Ambulatory Visit | Attending: Surgery

## 2014-06-03 ENCOUNTER — Encounter: Payer: Self-pay | Admitting: Surgery

## 2014-06-03 ENCOUNTER — Encounter: Payer: Self-pay | Admitting: Anesthesiology

## 2014-06-03 ENCOUNTER — Ambulatory Visit: Payer: Self-pay

## 2014-06-03 ENCOUNTER — Ambulatory Visit
Admit: 2014-06-03 | Disposition: A | Discharge status: Home / Self Care | Payer: Self-pay | Source: Ambulatory Visit | Attending: Surgery | Admitting: Surgery

## 2014-06-03 HISTORY — DX: Ventral hernia without obstruction or gangrene: K43.9

## 2014-06-03 HISTORY — PX: PR REPAIR INCISIONAL HERNIA,REDUCIBLE: 49560

## 2014-06-03 HISTORY — DX: Cat-scratch disease: A28.1

## 2014-06-03 SURGERY — REPAIR, HERNIA, VENTRAL
Anesthesia: General | Site: Abdomen | Wound class: Clean

## 2014-06-03 MED ORDER — BUPIVACAINE-EPINEPHRINE 0.25 % IJ SOLUTION *WRAPPED*
INTRAMUSCULAR | Status: DC | PRN
Start: 2014-06-03 — End: 2014-06-03
  Administered 2014-06-03: 30 mL via SUBCUTANEOUS

## 2014-06-03 MED ORDER — LIDOCAINE HCL 2 % (PF) IJ SOLN *I*
INTRAMUSCULAR | Status: AC
Start: 2014-06-03 — End: 2014-06-03
  Filled 2014-06-03: qty 5

## 2014-06-03 MED ORDER — ROCURONIUM BROMIDE 10 MG/ML IV SOLN *WRAPPED*
Status: AC
Start: 2014-06-03 — End: 2014-06-03
  Filled 2014-06-03: qty 5

## 2014-06-03 MED ORDER — GLYCOPYRROLATE 0.2 MG/ML IJ SOLN *WRAPPED*
INTRAMUSCULAR | Status: AC
Start: 2014-06-03 — End: 2014-06-03
  Filled 2014-06-03: qty 2

## 2014-06-03 MED ORDER — KETOROLAC TROMETHAMINE 30 MG/ML IJ SOLN *I*
30.0000 mg | Freq: Once | INTRAMUSCULAR | Status: AC
Start: 2014-06-03 — End: 2014-06-03
  Administered 2014-06-03: 30 mg via INTRAVENOUS
  Filled 2014-06-03: qty 1

## 2014-06-03 MED ORDER — MIDAZOLAM HCL 1 MG/ML IJ SOLN *I* WRAPPED
INTRAMUSCULAR | Status: DC | PRN
Start: 2014-06-03 — End: 2014-06-03
  Administered 2014-06-03: 2 mg via INTRAVENOUS

## 2014-06-03 MED ORDER — NEOSTIGMINE METHYLSULFATE 10 MG/10ML IV SOLN *I*
INTRAVENOUS | Status: DC | PRN
Start: 2014-06-03 — End: 2014-06-03
  Administered 2014-06-03: 3 mg via INTRAVENOUS

## 2014-06-03 MED ORDER — HYDROCODONE-ACETAMINOPHEN 5-325 MG PO TABS *I*
1.0000 | ORAL_TABLET | Freq: Four times a day (QID) | ORAL | Status: AC | PRN
Start: 2014-06-03 — End: 2014-06-17

## 2014-06-03 MED ORDER — MIDAZOLAM HCL 1 MG/ML IJ SOLN *I* WRAPPED
INTRAMUSCULAR | Status: AC
Start: 2014-06-03 — End: 2014-06-03
  Filled 2014-06-03: qty 2

## 2014-06-03 MED ORDER — ONDANSETRON HCL 2 MG/ML IV SOLN *I*
4.0000 mg | Freq: Once | INTRAMUSCULAR | Status: AC | PRN
Start: 2014-06-03 — End: 2014-06-03
  Administered 2014-06-03: 4 mg via INTRAVENOUS
  Filled 2014-06-03: qty 2

## 2014-06-03 MED ORDER — ROCURONIUM BROMIDE 10 MG/ML IV SOLN *WRAPPED*
Status: DC | PRN
Start: 2014-06-03 — End: 2014-06-03
  Administered 2014-06-03: 50 mg via INTRAVENOUS

## 2014-06-03 MED ORDER — NEOSTIGMINE METHYLSULFATE 10 MG/10ML IV SOLN *I*
INTRAVENOUS | Status: AC
Start: 2014-06-03 — End: 2014-06-03
  Filled 2014-06-03: qty 3

## 2014-06-03 MED ORDER — ONDANSETRON HCL 2 MG/ML IV SOLN *I*
INTRAMUSCULAR | Status: DC | PRN
Start: 2014-06-03 — End: 2014-06-03
  Administered 2014-06-03: 4 mg via INTRAMUSCULAR

## 2014-06-03 MED ORDER — SODIUM CHLORIDE 0.9 % IV SOLN WRAPPED *I*
20.0000 mL/h | Status: DC
Start: 2014-06-03 — End: 2014-06-04

## 2014-06-03 MED ORDER — LIDOCAINE HCL 1 % IJ SOLN *I*
INTRAMUSCULAR | Status: AC
Start: 2014-06-03 — End: 2014-06-03
  Filled 2014-06-03: qty 30

## 2014-06-03 MED ORDER — LACTATED RINGERS IV SOLN *I*
125.0000 mL/h | INTRAVENOUS | Status: DC
Start: 2014-06-03 — End: 2014-06-03
  Administered 2014-06-03: 125 mL/h via INTRAVENOUS

## 2014-06-03 MED ORDER — PROMETHAZINE HCL 25 MG/ML IJ SOLN *I*
6.2500 mg | Freq: Once | INTRAMUSCULAR | Status: AC | PRN
Start: 2014-06-03 — End: 2014-06-03
  Administered 2014-06-03: 6.25 mg via INTRAVENOUS
  Filled 2014-06-03: qty 1

## 2014-06-03 MED ORDER — BUPIVACAINE-EPINEPHRINE 0.25 % IJ SOLUTION *WRAPPED*
INTRAMUSCULAR | Status: AC
Start: 2014-06-03 — End: 2014-06-03
  Filled 2014-06-03: qty 30

## 2014-06-03 MED ORDER — PROPOFOL 10 MG/ML IV EMUL (INTERMITTENT DOSING) WRAPPED *I*
INTRAVENOUS | Status: DC | PRN
Start: 2014-06-03 — End: 2014-06-03
  Administered 2014-06-03: 250 mg via INTRAVENOUS

## 2014-06-03 MED ORDER — CLINDAMYCIN PHOSPHATE IN D5W 900 MG/50ML IV SOLN *I*
900.0000 mg | Freq: Once | INTRAVENOUS | Status: AC
Start: 2014-06-03 — End: 2014-06-03
  Administered 2014-06-03: 900 mg via INTRAVENOUS

## 2014-06-03 MED ORDER — PROPOFOL 10 MG/ML IV EMUL (INTERMITTENT DOSING) WRAPPED *I*
INTRAVENOUS | Status: AC
Start: 2014-06-03 — End: 2014-06-03
  Filled 2014-06-03: qty 20

## 2014-06-03 MED ORDER — HYDROCODONE-ACETAMINOPHEN 5-325 MG PO TABS *I*
1.0000 | ORAL_TABLET | Freq: Four times a day (QID) | ORAL | Status: DC | PRN
Start: 2014-06-03 — End: 2014-06-04

## 2014-06-03 MED ORDER — HEPARIN SODIUM 5000 UNIT/ML SQ *I*
SUBCUTANEOUS | Status: AC
Start: 2014-06-03 — End: 2014-06-03
  Administered 2014-06-03: 5000 [IU] via SUBCUTANEOUS
  Filled 2014-06-03: qty 1

## 2014-06-03 MED ORDER — DEXAMETHASONE SODIUM PHOSPHATE 4 MG/ML INJ SOLN *WRAPPED*
INTRAMUSCULAR | Status: DC | PRN
Start: 2014-06-03 — End: 2014-06-03
  Administered 2014-06-03: 4 mg via INTRAVENOUS

## 2014-06-03 MED ORDER — LIDOCAINE HCL 1 % IJ SOLN *I*
0.1000 mL | INTRAMUSCULAR | Status: DC | PRN
Start: 2014-06-03 — End: 2014-06-03
  Administered 2014-06-03: 0.1 mL via SUBCUTANEOUS

## 2014-06-03 MED ORDER — FENTANYL CITRATE 50 MCG/ML IJ SOLN *WRAPPED*
INTRAMUSCULAR | Status: DC | PRN
Start: 2014-06-03 — End: 2014-06-03
  Administered 2014-06-03: 150 ug via INTRAVENOUS
  Administered 2014-06-03: 17:00:00 25 ug via INTRAVENOUS

## 2014-06-03 MED ORDER — FENTANYL CITRATE 50 MCG/ML IJ SOLN *WRAPPED*
INTRAMUSCULAR | Status: AC
Start: 2014-06-03 — End: 2014-06-03
  Filled 2014-06-03: qty 5

## 2014-06-03 MED ORDER — ONDANSETRON HCL 2 MG/ML IV SOLN *I*
INTRAMUSCULAR | Status: AC
Start: 2014-06-03 — End: 2014-06-03
  Filled 2014-06-03: qty 2

## 2014-06-03 MED ORDER — SCOPOLAMINE BASE 1.5 MG TD PT72 *I*
1.0000 | MEDICATED_PATCH | TRANSDERMAL | Status: DC
Start: 2014-06-03 — End: 2014-06-04
  Filled 2014-06-03: qty 1

## 2014-06-03 MED ORDER — HEPARIN SODIUM 5000 UNIT/ML SQ *I*
5000.0000 [IU] | Freq: Once | SUBCUTANEOUS | Status: AC
Start: 2014-06-03 — End: 2014-06-03

## 2014-06-03 MED ORDER — LIDOCAINE HCL 2 % IJ SOLN *I*
INTRAMUSCULAR | Status: DC | PRN
Start: 2014-06-03 — End: 2014-06-03
  Administered 2014-06-03: 60 mg via INTRAVENOUS

## 2014-06-03 MED ORDER — CLINDAMYCIN PHOSPHATE IN D5W 900 MG/50ML IV SOLN *I*
INTRAVENOUS | Status: DC
Start: 2014-06-03 — End: 2014-06-04
  Filled 2014-06-03: qty 50

## 2014-06-03 MED ORDER — GLYCOPYRROLATE 0.2 MG/ML IJ SOLN *I*
INTRAMUSCULAR | Status: DC | PRN
Start: 2014-06-03 — End: 2014-06-03
  Administered 2014-06-03: 0.6 mg via INTRAVENOUS

## 2014-06-03 MED ORDER — DEXAMETHASONE SODIUM PHOSPHATE 4 MG/ML INJ SOLN *WRAPPED*
INTRAMUSCULAR | Status: AC
Start: 2014-06-03 — End: 2014-06-03
  Filled 2014-06-03: qty 1

## 2014-06-03 MED ORDER — HYDROMORPHONE HCL PF 1 MG/ML IJ SOLN *WRAPPED*
0.4000 mg | INTRAMUSCULAR | Status: DC | PRN
Start: 2014-06-03 — End: 2014-06-03
  Administered 2014-06-03: 0.4 mg via INTRAVENOUS
  Filled 2014-06-03: qty 1

## 2014-06-03 MED ORDER — LACTATED RINGERS IV SOLN *I*
20.0000 mL/h | INTRAVENOUS | Status: DC
Start: 2014-06-03 — End: 2014-06-03
  Administered 2014-06-03: 20 mL/h via INTRAVENOUS

## 2014-06-03 SURGICAL SUPPLY — 29 items
ADHESIVE DERMABOND GLUE HI VISCOSITY (Dressing)
ADHESIVE SKIN CLOSURE 0.7ML DERMABOND ADVANCED (Dressing) ×2 IMPLANT
APPLICATOR CHLORAPREP 26ML ORANGE LARGE (Solution) ×2 IMPLANT
BLADE CLIPPER SURG (Supply) ×1
BLADE SUR CLIPPER W37.2MM CUT H0.23MM GEN PURP EXISTING CLP HNDL DISP (Supply) ×1 IMPLANT
BLADE SUR W37.2MM CUT H0.23MM GEN PURP EXISTING CLP HNDL DISP (Supply) ×1 IMPLANT
BLANKET UPPER BODY TEMP THERAPY (Drape) ×2 IMPLANT
DRAIN PENROSE 18 X .25IN STER LTX (Supply) ×2 IMPLANT
GLOVE SURG DERMAPRENE ULTRA SZ7.5 PF LF (Glove) ×5 IMPLANT
GLOVE SURG PROTEXIS PI 7.5 PF SYN (Glove) ×9 IMPLANT
NEEDLE HYPO SAFETYGLIDE 25G X 1IN (Needle) ×2 IMPLANT
PACK CUSTOM GENERAL PACK (Pack) ×2 IMPLANT
PACK MINOR LINEN (Other) ×1
PACK SURGICAL PROCEDURE LINEN MINOR (Other) ×1 IMPLANT
PATCH HERNIAL VENTRALEX ST LG 3.2X3.2IN (Implant) ×2 IMPLANT
SOL H2O IRRIG STER 1000ML BTL (Solution) ×1
SOL SOD CHL IRRIG 1000ML BTL (Solution) ×1
SOL SODIUM CHLORIDE IRRIG 1000ML BTL (Solution) ×1 IMPLANT
SOL WATER IRRIG STERILE 1000ML BTL (Solution) ×1 IMPLANT
SPONGE K DISSECTOR (Sponge) ×1
SPONGE SUR W0.25XL9/16IN WHT COT RND KTNR DISECT RADPQ DISP (Sponge) ×1 IMPLANT
SUTR MONOCRYL PLUS 4-0 18PS2 (Suture) ×2 IMPLANT
SUTR PROLENE 0 CT-1 18IN C821G (Suture) IMPLANT
SUTR PROLENE MONO 0 CT-2 30 IN (Suture) IMPLANT
SUTR VICRYL ANTIB 2-0 SH 27 VIOLET (Suture) ×4 IMPLANT
SUTR VICRYL ANTIB 3-0 SH 18 UNDY (Suture) ×2 IMPLANT
SUTR VICRYL CTD 3-0 VIL LIGAPAK VIOLET (Suture) IMPLANT
SUTURE ETHBND EXCEL SZ 2 L27IN NONABSORBABLE GRN WHT MO-7 L22MM 1/2 CIR TAPERPOINT NDL (Suture) IMPLANT
SYRINGE LUERLOCK CNTL 10CC (Supply) ×2 IMPLANT

## 2014-06-03 NOTE — Discharge Instructions (Signed)
[facility] Discharge Instructions    Date: 06/03/2014  Procedure: Ventral hernia repair  Physician: Roe RutherfordJoseph A Johnson, MD    You have received sedative medication and/or general anesthesia which may make you drowsy for as long as 24 hours:  A) DO NOT drive or operate any machinery for 24 hours  B) DO NOT drink alcoholic beverages for 24 hours  C) DO NOT make major decisions, sign contracts, etc. for 24 hours    DIET - Resume your previous diet    PAIN MANAGEMENT - Per medication list below. Norco/Percocet contains tylenol (acetaminophen). Please do not take tylenol or tylenol containing products while on these medications to avoid tylenol overdose. Many over the counter medications contain acetaminophen. Do not exceed 4000 mg of tylenol (acetaminophen) per day.     CARE OF THE INCISION - After the surgery, the incisions will be covered with a Topical Skin Adhesive. The film will usually remain in place for 5 - 10 days then naturally fall off your skin.  Do not scratch, rub, or pick at the adhesive skin film.  This may loosen the film before your wound is healed.  If you are required to place a dressing over the incision, do not place tape over the skin adhesive since this may remove the skin adhesive. The stitches are underneath the skin and will dissolve.  You may shower tonight. Do not scrub the incision.  Just let the water run over it and then gently pat it dry. Do not submerge the incisions for 1 week (no baths, hot tubs, or swimming pools) or as directed by your physician.      COMFORT MEASURES - For the first few days, it is common for the area around the incision to be swollen, discolored (black & blue), and sore.  To help reduce swelling, apply an ice pack or bag of frozen peas to the swollen area for 15 to 20 minutes every hour for 3 days or more as needed.  Wear loose, comfortable clothing.  We recommend the use of over-the-counter anti-inflammatory medication such as ibuprofen (Advil, Motrin,  Aleve) to minimize swelling, inflammation, and pain.  Take medication every 4 - 6 hours with food.  If over-the-counter medications are ineffective, a narcotic pain medication prescribed by your doctor may be used in addition.  Be aware that some prescription pain medication can cause constipation, nausea, or vomiting.    ACTIVITY- Lifting should be restricted to 10 pounds or less for 2 weeks and 20 pounds or less for an additional 4 weeks following your surgery or as instructed by the physician.  Avoid pushing, pulling, straining, or any strenuous activity.   You may climb stairs.  You may drive if not using narcotic pain medication and if you are comfortable enough behind the wheel and can safely operate the vehicle.    DRIVING - You may drive when you are not using any prescription pain medication and when you are able to react normally.    RETURN TO WORK - You may return to work when you feel you are ready (usually 1 - 2 weeks.)  If your job involves heavy lifting, straining, or manual labor you will be out for 6 weeks.  If your job involves the above, you may be able to return to work sooner with restrictions as indicated by your doctor.      DRIVING - You may drive when you are not using any prescription pain medication and when you are able  to react normally.    FOLLOW-UP OFFICE VISIT - You will be seen in the office 2-3 weeks following your surgery.  You will be given this appointment when the date of your surgery has been set and it will be documented in the surgical paperwork mailed to you.    WHEN TO CALL THE OFFICE - Do not hesitate to call the office if you develop a fever (temperature greater than 101), shaking chills, a large amount of swelling or bruising (some scrotal swelling and bruising is common with inguinal hernia repairs), bleeding, increasing redness or drainage from your incision, trouble urinating, persistent or increased pain, nausea/vomiting, or with any other problem that concerns  you.    Call Dr. Roe Rutherford, MD  with questions/concerns.     Author: Lavina Hamman, MD  as of: 06/03/2014  at: 4:47 PM

## 2014-06-03 NOTE — Progress Notes (Signed)
Pt refused any pain meds.  Pt tolerating po fluids, complained of dizziness (history of it), refused scopolamine patch.  Pt stated "ready to go home". Pt met discharge criteria.  Pt discharged to home with wife.

## 2014-06-03 NOTE — Progress Notes (Signed)
Per dr. Eather ColasKoh ok for  torodol verbal with read back confirmed. Made aware of nausea, no vomiting all meds given, some relief patient able to sleep however still nauseous when waking up.

## 2014-06-03 NOTE — Progress Notes (Addendum)
Spoke with dr. Eather ColasKoh does not believe there is any more medication to aid with nausea. Patient able to sleep, no dry heaving, ranks pain 6-7  Decline pain medication at this time.would still like to try and go home

## 2014-06-03 NOTE — Anesthesia Preprocedure Evaluation (Addendum)
Anesthesia Pre-operative History and Physical for Raymond Maxwell    ______________________________________________________________________________________    Summary:  Raymond Maxwell presents preoperatively for anesthesia evaluation prior to ventral hernia repair with mesh. He has a past medical history of Ventral hernia and Cat scratch fever. The patient is moderately active.  Patient denies any known personal or family history of complications related to anesthesia.  By Renelda LomaPATRICIA A WEIR, NP at 1:17 PM on 06/03/2014    <[facility]>  Anesthesia Evaluation Information Source: patient, records     ANESTHESIA  Pertinent(-):  history of anesthetic complications, Family Hx of Anesthetic Complications    GENERAL    + Obesity  Pertinent (-):  communication issues, substance abuse, history of anesthetic complications, Family Hx of Anesthetic Complications    HEENT  Pertinent (-):   Corrective Eyewear, glaucoma PULMONARY  Pertinent(-): smoking, asthma, shortness of breath, pneumonia, recent URI, COPD    CARDIOVASCULAR  Good(4+METs) Exercise Tolerance  Pertinent(-):  hypertension, past MI, CAD, CHF, DVT    GI/HEPATIC/RENAL  Last PO Intake: >8hr before procedure    + Alcohol use  Pertinent(-):  GERD, nausea, vomiting, renal issues, urinary issues NEURO/PSYCH  Pertinent(-):  syncope, seizures, cerebrovascular event    ENDO/OTHER  Pertinent(-):  diabetes mellitus, thyroid disease    HEMALOGIC  Pertinent(-):  bruises/bleeds easily       Physical Exam    Airway            Mouth opening: normal            Mallampati: II            TM distance (fb): >3 FB            TM distance (cm): 4            Neck ROM: full  Dental        Cardiovascular           Rhythm: regular           Rate: normal  No friction rub, systolic click or murmur    Neurologic    Normal Exam  No sensory deficit and motor deficit    General Survey    No rashes, wounds   Pulmonary     breath sounds clear to auscultation    No cough, rhonchi, decreased breath  sounds    Mental Status   Normal Exam    oriented to person, place and time    Not confused, anxious or depressed       ________________________________________________________________________  Plan  ASA Score  2  Anesthetic Plan general    Induction (routine IV); General Anesthesia/Sedation Maintenance Plan (inhaled agents);  Airway Manipulation (direct laryngoscopy); Airway (cuffed ETT); Line ( use current access); Monitoring (standard ASA); Positioning (supine); PONV Plan (dexamethasone and ondansetron); Pain (per surgical team); PostOp (PACU)    Informed Consent     Risks:          Risks discussed were commensurate with the plan listed above with the following specific points: N/V, aspiration and sore throat , damage to:(eyes, nerves, teeth), awareness, unexpected serious injury, allergic Rx    Anesthetic Consent:      Anesthetic plan (and risks as noted above) were discussed with patient    Attending Attestation:  As the primary attending anesthesiologist, I attest that the patient or proxy understands and accepts the risks and benefits of the anesthesia plan. I also attest that I have personally performed a pre-anesthetic examination and evaluation, and prescribed  the anesthetic plan for this particular location within 48 hours prior to the anesthetic as documented.

## 2014-06-03 NOTE — Anesthesia Case Conclusion (Signed)
CASE CONCLUSION  Emergence  Actions:  Suctioned and extubated  Criteria Used for Airway Removal:  Adequate Tv & RR and acceptable O2 saturation  Assessment:  Routine  Transport  Directly to: PACU  Position:  Supine  Patient Condition on Handoff  Level of Consciousness:  Mildly sedated  Patient Condition:  Stable  Handoff Report to:  RN

## 2014-06-03 NOTE — INTERIM OP NOTE (Signed)
Interim Op Note (Surgical Log ID: [id number])       Date of Surgery: 06/03/2014       Surgeons: Moishe SpiceSurgeon(s) and Role:     * Lavina HammanBoodry, Paulanthony Gleaves, MD - Resident - Assisting     * Laural BenesJohnson, Julien NordmannJoseph A, MD - Primary       Pre-op Diagnosis: Pre-Op Diagnosis Codes:     * Ventral hernia without obstruction or gangrene [K43.9]       Post-op Diagnosis: Post-Op Diagnosis Codes:     * Ventral hernia without obstruction or gangrene [K43.9]       Procedure(s) Performed: Procedure:    VENTRAL HERNIA REPAIR WITH MESH     CPT(R) Code:  6045449560 - PR REPAIR INCISIONAL HERNIA,REDUCIBLE       Additional CPT Codes:        Anesthesia Type: General        Fluid Totals: I/O this shift:  04/20 0600 - 04/20 2259  In: 1000 (10.8 mL/kg) [I.V.:1000]  Out: - (0 mL/kg)   Net: 1000  Weight: 93 kg        Estimated Blood Loss: No Data Recorded       Specimens to Pathology:  * No specimens in log *       Temporary Implants:        Packing:                 Patient Condition: good       Findings (Including unexpected complications): Three facial defects, repaired with mesh     Signed:  Lavina Hammanourtney Nyja Westbrook, MD  on 06/03/2014 at 4:47 PM

## 2014-06-03 NOTE — Anesthesia Procedure Notes (Signed)
---------------------------------------------------------------------------------------------------------------------------------------    AIRWAY   GENERAL INFORMATION AND STAFF    Patient location during procedure: OR       Date of Procedure: 06/03/2014 3:28 PM  CONDITION PRIOR TO MANIPULATION     Current Airway/Neck Condition:  Normal        For more airway physical exam details, see Anesthesia PreOp Evaluation  AIRWAY METHOD     Patient Position:  Sniffing    Preoxygenated: yes      Induction: IV    Mask Difficulty Assessment:  1 - vent by mask       Mask NMB: 1 - vent by mask      Technique Used for Successful ETT Placement:  Direct laryngoscopy    Blade Type:  Macintosh    Laryngoscope Blade/Video laryngoscope Blade Size:  3    Cormack-Lehane Classification:  Grade I - full view of glottis    Placement Verified by: capnometry and auscultation      Number of Attempts at Approach:  1  FINAL AIRWAY DETAILS    Final Airway Type:  Endotracheal airway    Final Endotracheal Airway:  ETT    Insertion Site:  Oral    ETT Size (mm):  7.5    Distance inserted from Lips (cm):  23  ----------------------------------------------------------------------------------------------------------------------------------------

## 2014-06-03 NOTE — H&P (View-Only) (Signed)
Anesthesia Pre-operative History and Physical for Raymond MurphyMartin J Whisenant    ______________________________________________________________________________________    Summary:  Raymond MurphyMartin J Dewilde presents preoperatively for anesthesia evaluation prior to ventral hernia repair with mesh. He has a past medical history of Ventral hernia and Cat scratch fever. The patient is moderately active.  Patient denies any known personal or family history of complications related to anesthesia.  By Renelda LomaPATRICIA A Geneva Pallas, NP at 1:17 PM on 06/03/2014    <[facility]>  Anesthesia Evaluation Information Source: patient, records     ANESTHESIA  Pertinent(-):  history of anesthetic complications, Family Hx of Anesthetic Complications    GENERAL    + Obesity  Pertinent (-):  communication issues, substance abuse, history of anesthetic complications, Family Hx of Anesthetic Complications    HEENT  Pertinent (-):   Corrective Eyewear, glaucoma PULMONARY  Pertinent(-): smoking, asthma, shortness of breath, pneumonia, recent URI, COPD    CARDIOVASCULAR  Good(4+METs) Exercise Tolerance  Pertinent(-):  hypertension, past MI, CAD, CHF, DVT    GI/HEPATIC/RENAL  Last PO Intake: Enter Last PO Intake in ROS/Med Hx Tab    + Alcohol use  Pertinent(-):  GERD, nausea, vomiting, renal issues, urinary issues NEURO/PSYCH  Pertinent(-):  syncope, seizures, cerebrovascular event    ENDO/OTHER  Pertinent(-):  diabetes mellitus, thyroid disease    HEMALOGIC  Pertinent(-):  bruises/bleeds easily       Physical Exam    Airway            Mouth opening: normal            Neck ROM: full  Dental        Cardiovascular           Rhythm: regular           Rate: normal  No friction rub, systolic click or murmur    Neurologic    Normal Exam  No sensory deficit and motor deficit    General Survey    No rashes, wounds   Pulmonary     breath sounds clear to auscultation    No cough, rhonchi, decreased breath sounds    Mental Status   Normal Exam    oriented to person, place and time     Not confused, anxious or depressed       ________________________________________________________________________  Ermalinda MemosAnes Plan  Anesthesia Consent Not Performed

## 2014-06-03 NOTE — Interval H&P Note (Signed)
UPDATES TO PATIENT'S CONDITION on the DAY OF SURGERY/PROCEDURE    I. Updates to Patient's Condition (to be completed by a provider privileged to complete a H&P, following reassessment of the patient by the provider):    Full H&P done today; no updates needed.            II. Procedure Readiness   I have reviewed the patient's H&P and updated condition. By completing and signing this form, I attest that this patient is ready for surgery/procedure.      III. Attestation   I have reviewed the updated information regarding the patient's condition and it is appropriate to proceed with the planned surgery/procedure.    Raymond RutherfordJOSEPH A Kalayah Leske, MD as of 1:22 PM 06/03/2014

## 2014-06-04 NOTE — Anesthesia Postprocedure Evaluation (Signed)
Anesthesia Post-Op Note    Patient: Soyla MurphyMartin J Endsley    Procedure(s) Performed:  Procedure Summary     Date Anesthesia Start Anesthesia Stop Room / Location    06/03/14 1511 [id number] [id number] / [facility] MAIN OR       Procedure Diagnosis Surgeon Attending Anesthesia    VENTRAL HERNIA REPAIR WITH MESH    (N/A Abdomen) Ventral hernia without obstruction or gangrene Roe RutherfordJohnson, Joseph A, MD Buckner MaltaKoh, Tiara Bartoli Sung Jin, DO     (ventral hernia)          Anesthesia type:  General  Complications Noted (Any):  None   Comment:    Patient Location:  PACU  Level of Consciousness:    Recovered to baseline  Patient Participation:     Able to participate  Temperature Status:    Normothermic  Oxygen Saturation:    Within patient's normal range  Cardiac Status:   Within patient's normal range  Fluid Status:    Stable  Airway Patency:     Yes  Pulmonary Status:    Baseline  Pain Management:    Adequate analgesia  Nausea and Vomiting:    Controlled    Post Op Assessment:    Tolerated procedure well   Attending Attestation:  All indicated post anesthesia care provided

## 2014-06-09 NOTE — Op Note (Signed)
Raymond Maxwell:   Fabel, Jerrol J MR #:  [id number]   ACCOUNT #:  [id number] DOB:  1965-12-31    AGE:  48     SURGEON:  Roe RutherfordJoseph A Alveria Mcglaughlin, MD  CO-SURGEON:    ASSISTANT:    SURGERY DATE:  06/03/2014    PREOPERATIVE DIAGNOSIS:  Ventral hernia.    POSTOPERATIVE DIAGNOSIS:  Ventral hernia.    OPERATIVE PROCEDURE:  Ventral hernia repair with mesh.    ANESTHESIA:  General.    DESCRIPTION OF PROCEDURE:  After satisfactory induction of general anesthesia, the patient was prepped and draped in standard fashion with ChloraPrep.  The patient had a transverse incision over the hernia made.  The hernia was dissected free.  The umbilicus was dissected free.  The hernia was reduced and the properitoneal space dissected free.  A 3.2 inch Ventralex patch was placed into this space and spread out.  The fascial flaps were raised.  The fascia was closed in a transverse direction with a U-stitch of 0 Vicryl connecting fascia to tabs.  An additional stitch x2 on either side with 0 Vicryl was then placed connecting fascia to fascia. 3-0 Vicryl was used to reapproximate the umbilicus and subcutaneous tissue. 4-0 Monocryl was used to close the skin in subcuticular fashion.  Dermabond was used.  The patient was brought in satisfactory, extubated condition to the recovery room.             ______________________________  Roe RutherfordJoseph A Spirit Wernli, MD    JAJ/MODL  DD:  06/09/2014 11:00:09  DT:  06/09/2014 14:31:27  Job #:  [id number]    cc:

## 2014-06-10 ENCOUNTER — Encounter: Payer: Self-pay | Admitting: Surgery

## 2014-06-23 ENCOUNTER — Ambulatory Visit: Payer: Self-pay | Admitting: Surgery

## 2014-06-23 ENCOUNTER — Encounter: Payer: Self-pay | Admitting: Surgery

## 2014-06-23 VITALS — BP 106/69 | HR 56 | Temp 96.4°F | Resp 18 | Ht 66.0 in | Wt 201.8 lb

## 2014-06-23 DIAGNOSIS — Z4889 Encounter for other specified surgical aftercare: Secondary | ICD-10-CM | POA: Insufficient documentation

## 2014-06-23 NOTE — Patient Instructions (Signed)
General Surgery Post-operative instructions    Activity: No restriction.    Diet: Resume pre-operative diet    Wound care: Wash incision with antibacterial soap  Take a shower, pat incision dry  Do not bathe or immerse in water for 2 weeks

## 2014-06-23 NOTE — Progress Notes (Signed)
Post-operative Visit    Subjective:       Raymond Maxwell presents to the clinic status post ventral hernia repair with mesh on 06/03/14 by Roe RutherfordJohnson, Joseph A, MD.  Raymond Maxwell is eating a regular diet without difficulty.    Bowel movements are normal. Pain is controlled without any medications..      Objective:      Blood pressure 106/69, pulse 56, temperature 35.8 C (96.4 F), resp. rate 18, height 1.676 m (5\' 6" ), weight 91.536 kg (201 lb 12.8 oz), SpO2 97 %.    General:  No acute distress   Heart:  Regular, rate, and rhythm   Lungs Clear to auscultation bilaterally   Abdomen: soft, bowel sounds active, non-tender. Drains/tubes:  none    Incision:   healing well, no drainage, no erythema, no hernia, no seroma, no swelling, no dehiscence, incision well approximated         Pathology reviewed: N/A    Assessment:      Raymond Maxwell is doing well postoperatively.      Plan:    1. Continue any current medications.  2. Wound care discussed.  3. Pt is to increase activities as tolerated.  4. Follow up appointment as needed. Patient instructed to call office with any questions or concerns.    5. RTW 06/29/14

## 2015-05-20 DIAGNOSIS — H524 Presbyopia: Secondary | ICD-10-CM | POA: Diagnosis not present

## 2015-06-30 DIAGNOSIS — M722 Plantar fascial fibromatosis: Secondary | ICD-10-CM | POA: Diagnosis not present

## 2015-10-20 ENCOUNTER — Ambulatory Visit (INDEPENDENT_AMBULATORY_CARE_PROVIDER_SITE_OTHER): Payer: 59 | Admitting: Family Medicine

## 2015-10-20 ENCOUNTER — Other Ambulatory Visit: Payer: Self-pay | Admitting: Family Medicine

## 2015-10-20 ENCOUNTER — Encounter: Payer: Self-pay | Admitting: Family Medicine

## 2015-10-20 ENCOUNTER — Telehealth: Payer: Self-pay | Admitting: Family Medicine

## 2015-10-20 VITALS — BP 122/90 | HR 71 | Resp 12 | Ht 67.0 in | Wt 206.5 lb

## 2015-10-20 DIAGNOSIS — G2581 Restless legs syndrome: Secondary | ICD-10-CM | POA: Insufficient documentation

## 2015-10-20 DIAGNOSIS — G5600 Carpal tunnel syndrome, unspecified upper limb: Secondary | ICD-10-CM | POA: Diagnosis not present

## 2015-10-20 DIAGNOSIS — R202 Paresthesia of skin: Secondary | ICD-10-CM

## 2015-10-20 DIAGNOSIS — R200 Anesthesia of skin: Secondary | ICD-10-CM | POA: Insufficient documentation

## 2015-10-20 LAB — CBC WITH DIFFERENTIAL/PLATELET
BASOS ABS: 0 10*3/uL (ref 0.0–0.1)
Basophils Relative: 0.4 % (ref 0.0–3.0)
EOS PCT: 5.2 % — AB (ref 0.0–5.0)
Eosinophils Absolute: 0.3 10*3/uL (ref 0.0–0.7)
HCT: 44.1 % (ref 39.0–52.0)
HEMOGLOBIN: 15.3 g/dL (ref 13.0–17.0)
LYMPHS ABS: 2.3 10*3/uL (ref 0.7–4.0)
Lymphocytes Relative: 38.2 % (ref 12.0–46.0)
MCHC: 34.8 g/dL (ref 30.0–36.0)
MCV: 87.2 fl (ref 78.0–100.0)
MONO ABS: 0.3 10*3/uL (ref 0.1–1.0)
MONOS PCT: 5.1 % (ref 3.0–12.0)
NEUTROS PCT: 51.1 % (ref 43.0–77.0)
Neutro Abs: 3.1 10*3/uL (ref 1.4–7.7)
Platelets: 208 10*3/uL (ref 150.0–400.0)
RBC: 5.05 Mil/uL (ref 4.22–5.81)
RDW: 12.8 % (ref 11.5–15.5)
WBC: 6.1 10*3/uL (ref 4.0–10.5)

## 2015-10-20 LAB — COMPREHENSIVE METABOLIC PANEL
ALBUMIN: 4.5 g/dL (ref 3.5–5.2)
ALT: 26 U/L (ref 0–53)
AST: 17 U/L (ref 0–37)
Alkaline Phosphatase: 51 U/L (ref 39–117)
BILIRUBIN TOTAL: 1 mg/dL (ref 0.2–1.2)
BUN: 14 mg/dL (ref 6–23)
CALCIUM: 9.1 mg/dL (ref 8.4–10.5)
CHLORIDE: 105 meq/L (ref 96–112)
CO2: 28 meq/L (ref 19–32)
CREATININE: 0.95 mg/dL (ref 0.40–1.50)
GFR: 89.86 mL/min (ref >60.00)
Glucose, Bld: 89 mg/dL (ref 70–99)
Potassium: 4.1 mEq/L (ref 3.5–5.1)
SODIUM: 140 meq/L (ref 135–145)
Total Protein: 7.4 g/dL (ref 6.0–8.3)

## 2015-10-20 LAB — TSH: TSH: 2 u[IU]/mL (ref 0.35–4.50)

## 2015-10-20 LAB — VITAMIN B12: Vitamin B-12: 434 pg/mL (ref 211–911)

## 2015-10-20 LAB — HEMOGLOBIN A1C: Hgb A1c MFr Bld: 5.4 % (ref 4.6–6.5)

## 2015-10-20 NOTE — Telephone Encounter (Signed)
It is Ok to add lyme test, I did placed order. Thanks, BJ

## 2015-10-20 NOTE — Telephone Encounter (Signed)
Called and let patient know that the lab was added.

## 2015-10-20 NOTE — Telephone Encounter (Signed)
Okay to add lab? ?

## 2015-10-20 NOTE — Progress Notes (Signed)
Pre visit review using our clinic review tool, if applicable. No additional management support is needed unless otherwise documented below in the visit note. 

## 2015-10-20 NOTE — Patient Instructions (Addendum)
A few things to remember from today's visit:   Carpal tunnel syndrome, unspecified laterality  Numbness and tingling - Plan: Hemoglobin A1c, CMP, Vitamin B12, TSH, RPR, MR Brain W Wo Contrast, MR Thoracic Spine W Wo Contrast, MR Cervical Spine W Wo Contrast, MR Lumbar Spine W Wo Contrast, HIV antibody (with reflex), CBC with Differential/Platelet, Ambulatory referral to Neurology, B. burgdorfi Antibody  RLS (restless legs syndrome) - Plan: CBC with Differential/Platelet  Recommendations will be given according to lab results.   Numbness and tingling might be related to 1 disease or to multiple diseases, carpal tunnel syndrome, lumbar radicular pain among some.  Please be sure medication list is accurate. If a new problem present, please set up appointment sooner than planned today.

## 2015-10-20 NOTE — Telephone Encounter (Signed)
Lab already drawn, awaiting results.

## 2015-10-20 NOTE — Progress Notes (Signed)
HPI:   [name] is a 50 y.o. male, who is here today to establish care with me.  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 Last preventive routine visit: 2-3 years ago.  Concerns today: numbness/tingling.  Since 09/2014 when he just moved to the area he started with RLE numbness that has been constant since then, no focal weakness. He has also noted intermittent foot tinging/shooting/eleceticity like discomfort, mainly at night.  He has had intermittent lower back pain, no radiated, mild, for about 20 years. It is not bother him now, pain is not bad enough to limit his daily activities and usually he has no pain if he avoids activities he knows can aggravate problem. Hx of DDD thoracic and lumbar, reports MRI done years ago.  No saddle anesthesia or urine/bowel incontinence.  Denies associated fever, chills, abnormal wt loss, arthralgias, myalgias, oral lesions, dysphagia,abdominal pain, changes in bowel habits, nausea, vomiting, gross hematuria, dysuria, skin rash, or headache.  2 months ago he noted facial numbness, intermittently.  Legs "twitching" sensation, he cannot explain feeling but he needs to move legs to relieve symptoms, he has not noted fasciculations or muscle spasms.  Hands numbness/tingling, intermittently, at night mainly. Shaking hands alleviate numbness. No weakness. No cervical pain.  + Anxious.nerveous, which he attributes to these symptoms. Denies depression.  No Hx of insect/tick bites.  No Hx of DM II, occupational chemical exposure   Review of Systems  Constitutional: Negative for activity change, appetite change, fatigue, fever and unexpected weight change.  HENT: Negative for hearing loss, nosebleeds, sore throat, trouble swallowing and voice change.   Eyes: Negative for redness and visual disturbance.  Respiratory: Negative for cough, shortness of breath and wheezing.   Cardiovascular: Negative for chest pain,  palpitations and leg swelling.  Gastrointestinal: Negative for abdominal pain, nausea and vomiting.       No changes in bowel habits.  Genitourinary: Negative for decreased urine volume, difficulty urinating, dysuria and hematuria.  Musculoskeletal: Positive for back pain. Negative for arthralgias, gait problem, myalgias and neck pain.  Skin: Negative for color change, rash and wound.  Neurological: Negative for dizziness, seizures, weakness, numbness and headaches.  Psychiatric/Behavioral: Negative for confusion and sleep disturbance. The patient is nervous/anxious.       No current outpatient prescriptions on file prior to visit.   No current facility-administered medications on file prior to visit.      No past medical history on file. Not on File Negative.  Family History  Problem Relation Age of Onset  . Adopted: Yes    Social History   Social History  . Marital status: Single    Spouse name: N/A  . Number of children: N/A  . Years of education: N/A   Social History Main Topics  . Smoking status: Never Smoker  . Smokeless tobacco: Never Used  . Alcohol use Yes     Comment: one beer occasionally  . Drug use: No  . Sexual activity: Yes   Other Topics Concern  . None   Social History Narrative  . None    Vitals:   10/20/15 0840  BP: 122/90  Pulse: 71  Resp: 12    Body mass index is 32.34 kg/m.   Physical Exam  Nursing note and vitals reviewed. Constitutional: He is oriented to person, place, and time. He appears well-developed. No distress.  HENT:  Head: Atraumatic.  Mouth/Throat: Oropharynx is clear and moist and  mucous membranes are normal.  Eyes: Conjunctivae and EOM are normal. Pupils are equal, round, and reactive to light.  Neck: No thyroid mass and no thyromegaly present.  Cardiovascular: Normal rate and regular rhythm.   No murmur heard. Pulses:      Radial pulses are 2+ on the right side, and 2+ on the left side.       Dorsalis pedis  pulses are 2+ on the right side, and 2+ on the left side.  Respiratory: Effort normal and breath sounds normal. No respiratory distress.  GI: Soft. He exhibits no mass. There is no hepatomegaly. There is no tenderness.  Musculoskeletal: He exhibits no edema or tenderness.  No tenderness upon palpation of paraspinal muscles thoracic, cervical, and lumbar bilateral. No muscle atrophy appreciated. Tinel and Phalen negative bilateral.  Lymphadenopathy:    He has no cervical adenopathy.  Neurological: He is alert and oriented to person, place, and time. He has normal strength. No cranial nerve deficit or sensory deficit. Coordination and gait normal.  Reflex Scores:      Bicep reflexes are 2+ on the right side and 2+ on the left side.      Patellar reflexes are 2+ on the right side and 2+ on the left side. Skin: Skin is warm. No rash noted. No erythema.  Psychiatric: His speech is normal. His mood appears anxious. Cognition and memory are normal.  Well groomed, good eye contact.      ASSESSMENT AND PLAN:     [place] was seen today for new patient (initial visit).  Diagnoses and all orders for this visit:   Lab Results  Component Value Date   WBC 6.1 10/20/2015   HGB 15.3 10/20/2015   HCT 44.1 10/20/2015   MCV 87.2 10/20/2015   PLT 208.0 10/20/2015     Chemistry      Component Value Date/Time   NA 140 10/20/2015 0938   K 4.1 10/20/2015 0938   CL 105 10/20/2015 0938   CO2 28 10/20/2015 0938   BUN 14 10/20/2015 0938   CREATININE 0.95 10/20/2015 0938      Component Value Date/Time   CALCIUM 9.1 10/20/2015 0938   ALKPHOS 51 10/20/2015 0938   AST 17 10/20/2015 0938   ALT 26 10/20/2015 0938   BILITOT 1.0 10/20/2015 0938     Lab Results  Component Value Date   TSH 2.00 10/20/2015   Lab Results  Component Value Date   VITAMINB12 434 10/20/2015    Carpal tunnel syndrome, unspecified laterality  Numbness reported on hands seems to suggest carpal tunnel synd. Wrist  splint at bedtime for 3-4 weeks may help.  Numbness and tingling  Involving several areas, not specific dermatoma. We discussed possible causes, MS is a concerned. He is concerned about possibility of ALS, today neurologic examination normal and he is not reporting focal motor deficit.  Symptoms can be related to one etiology vs different problems not related (radiculopathy LE, carpal tunnel synd among some.)  Today work-up ordered to rule out possible causes, further recommendations will be given accordingly. Clearly instructed about warning signs. Referral to neurologists placed.    -     Hemoglobin A1c -     CMP -     Vitamin B12 -     TSH -     RPR -     MR Brain W Wo Contrast; Future -     MR Thoracic Spine W Wo Contrast; Future -     MR Cervical Spine  W Wo Contrast; Future -     MR Lumbar Spine W Wo Contrast; Future -     HIV antibody (with reflex) -     CBC with Differential/Platelet -     Ambulatory referral to Neurology -     B. burgdorfi Antibody  RLS (restless legs syndrome)  LE extremity discomfort he described suggest RLS. He would like to hold on any type of treatment until work-up results are back.  -     CBC with Differential/Platelet           [name]. Martinique, MD  [facility] [place]. [place] office.

## 2015-10-20 NOTE — Telephone Encounter (Signed)
Pt seen this am and wants to know if we can add the test for  Lyme disease to his labs he did this morning.   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

## 2015-10-21 LAB — HIV ANTIBODY (ROUTINE TESTING W REFLEX): HIV 1&2 Ab, 4th Generation: NONREACTIVE

## 2015-10-21 LAB — LYME AB/WESTERN BLOT REFLEX

## 2015-10-21 LAB — RPR

## 2015-10-22 ENCOUNTER — Encounter: Payer: Self-pay | Admitting: Family Medicine

## 2015-10-29 ENCOUNTER — Ambulatory Visit
Admission: RE | Admit: 2015-10-29 | Discharge: 2015-10-29 | Disposition: A | Discharge status: Home / Self Care | Payer: 59 | Source: Ambulatory Visit | Attending: Family Medicine | Admitting: Family Medicine

## 2015-10-29 DIAGNOSIS — R200 Anesthesia of skin: Secondary | ICD-10-CM

## 2015-10-29 DIAGNOSIS — R202 Paresthesia of skin: Principal | ICD-10-CM

## 2015-11-01 ENCOUNTER — Ambulatory Visit
Admission: RE | Admit: 2015-11-01 | Discharge: 2015-11-01 | Disposition: A | Discharge status: Home / Self Care | Payer: 59 | Source: Ambulatory Visit | Attending: Family Medicine | Admitting: Family Medicine

## 2015-11-01 ENCOUNTER — Telehealth: Payer: Self-pay

## 2015-11-01 DIAGNOSIS — R202 Paresthesia of skin: Principal | ICD-10-CM

## 2015-11-01 DIAGNOSIS — R200 Anesthesia of skin: Secondary | ICD-10-CM

## 2015-11-01 NOTE — Telephone Encounter (Signed)
Received phone call from [place] at [facility]. Patient came in for his MRI's today, but was claustrophobic and did not want any medications. They just wanted you to be aware.

## 2015-11-04 ENCOUNTER — Encounter: Payer: Self-pay | Admitting: Family Medicine

## 2015-11-04 ENCOUNTER — Other Ambulatory Visit: Payer: Self-pay | Admitting: Family Medicine

## 2015-11-04 DIAGNOSIS — R202 Paresthesia of skin: Principal | ICD-10-CM

## 2015-11-04 DIAGNOSIS — R200 Anesthesia of skin: Secondary | ICD-10-CM

## 2015-11-09 ENCOUNTER — Telehealth: Payer: Self-pay | Admitting: Family Medicine

## 2015-11-09 DIAGNOSIS — R200 Anesthesia of skin: Secondary | ICD-10-CM

## 2015-11-09 DIAGNOSIS — R202 Paresthesia of skin: Principal | ICD-10-CM

## 2015-11-09 NOTE — Telephone Encounter (Signed)
Placed new MRI order. Left message for patient letting him know & to call with any questions.

## 2015-11-09 NOTE — Telephone Encounter (Signed)
Pt has been trying to do the mri's you had referred him to. However, pt has found out he is extremely claustrophobic.  Pt has now rescheduled the MRI for the lower lumber. However, they advised ok to do without contrast, so that it will less time he will have to be in the machine.  Pt would like a new order for a MRI without contrast. Pt is already scheduled next Tues., so all he needs is the new order.  Pt would like a call back when this has been changed.  FYI:  For some reason, neuro could not reach him, so he has not been scheduled yet. I transferred pt to [place] neuro/

## 2015-11-16 ENCOUNTER — Inpatient Hospital Stay: Admission: RE | Admit: 2015-11-16 | Payer: 59 | Source: Ambulatory Visit

## 2015-11-18 ENCOUNTER — Ambulatory Visit (INDEPENDENT_AMBULATORY_CARE_PROVIDER_SITE_OTHER): Payer: 59 | Admitting: Neurology

## 2015-11-18 ENCOUNTER — Encounter: Payer: Self-pay | Admitting: Neurology

## 2015-11-18 ENCOUNTER — Other Ambulatory Visit (INDEPENDENT_AMBULATORY_CARE_PROVIDER_SITE_OTHER): Payer: 59

## 2015-11-18 VITALS — BP 124/70 | HR 76 | Temp 98.2°F | Ht 67.0 in | Wt 209.1 lb

## 2015-11-18 DIAGNOSIS — R202 Paresthesia of skin: Secondary | ICD-10-CM | POA: Diagnosis not present

## 2015-11-18 DIAGNOSIS — R200 Anesthesia of skin: Secondary | ICD-10-CM | POA: Diagnosis not present

## 2015-11-18 LAB — FERRITIN: FERRITIN: 170.9 ng/mL (ref 22.0–322.0)

## 2015-11-18 LAB — C-REACTIVE PROTEIN: CRP: 0.1 mg/dL — ABNORMAL LOW (ref 0.5–20.0)

## 2015-11-18 LAB — SEDIMENTATION RATE: Sed Rate: 4 mm/hr (ref 0–15)

## 2015-11-18 NOTE — Progress Notes (Signed)
NEUROLOGY CONSULTATION NOTE  Cody [name] MRN: [id number] DOB: 01-24-1966  Referring provider: Dr. Betty Martinique Primary care provider: Dr. Betty Martinique  Reason for consult:  Numbness/tingling  Dear Dr Martinique:  Thank you for your kind referral of Cody [name] for consultation of the above symptoms. Although his history is well known to you, please allow me to reiterate it for the purpose of our medical record. Records and images were personally reviewed where available.  HISTORY OF PRESENT ILLNESS: This is a pleasant 50 year old man with no significant past medical history, in his usual state of health until a year ago. While they were in the process of moving to [facility], he had some mid-back pain bothering him for 1-2 weeks that resolved. When he drove down here, his right leg down to his ankle started becoming numb with pins and needles sensation. This has been constant for the past 15 months. In mid-October, his mid-back again started bothering him for 1-2 weeks then resolved. In March/April 2017, he started noticing intermittent numbness and tingling on his face (both sides), "like not getting enough blood." There was no associated headache or dizziness. He noticed he would wake up with both hands being numb, resolving after 1-2 minutes of shaking his hands. Around 6 months ago, he started having an electrical sensation in the toes of both feet (similar to how a TENS unit feels like), as well as burning pain in the heel of the left foot. Around 4 months ago, most worrisome for him is what he calls "twitching" in his shoulders, hips, legs, but when asked to describe it, symptoms are more suggestive of a restless sensation where he feels like there are bees in his face/body and he has to move them. He feels these more in his legs at night, and feels better when he walks. He feels relief when he moves his shoulders. He denies any neck pain, and no back pain at this time, no  bowel/bladder dysfunction. He drinks alcohol socially. He feels his cognition is a little different, he is good in Math but has noticed he has "lost a little bit." He denies any diplopia, dysarthria/dysphagia. His equilibrium has been off since he had an inner ear infection many years ago. He denies any recent infections, head injuries, no known family history of similar symptoms (he is adopted).  His PCP ordered bloodwork, with normal CBC, CMP, HbA1c, B12, TSH. Negative RPR, HIV, Lyme Ab. He had a very difficult time doing an open MRI and was unable to complete it. He does not want to take PO sedatives for the MRI.   Laboratory Data: Lab Results  Component Value Date   WBC 6.1 10/20/2015   HGB 15.3 10/20/2015   HCT 44.1 10/20/2015   MCV 87.2 10/20/2015   PLT 208.0 10/20/2015     Chemistry      Component Value Date/Time   NA 140 10/20/2015 0938   K 4.1 10/20/2015 0938   CL 105 10/20/2015 0938   CO2 28 10/20/2015 0938   BUN 14 10/20/2015 0938   CREATININE 0.95 10/20/2015 0938      Component Value Date/Time   CALCIUM 9.1 10/20/2015 0938   ALKPHOS 51 10/20/2015 0938   AST 17 10/20/2015 0938   ALT 26 10/20/2015 0938   BILITOT 1.0 10/20/2015 0938     Lab Results  Component Value Date   TSH 2.00 10/20/2015   Lab Results  Component Value Date   VITAMINB12 434 10/20/2015  PAST MEDICAL HISTORY: History reviewed. No pertinent past medical history.  PAST SURGICAL HISTORY: Past Surgical History:  Procedure Laterality Date  . CHOLECYSTECTOMY      MEDICATIONS: No current outpatient prescriptions on file prior to visit.   No current facility-administered medications on file prior to visit.     ALLERGIES: Allergies  Allergen Reactions  . Dust Mite Mixed Allergen Ext [Mite (D. Farinae)]     FAMILY HISTORY: Family History  Problem Relation Age of Onset  . Adopted: Yes    SOCIAL HISTORY: Social History   Social History  . Marital status: Married    Spouse name:  N/A  . Number of children: N/A  . Years of education: N/A   Occupational History  . Not on file.   Social History Main Topics  . Smoking status: Never Smoker  . Smokeless tobacco: Never Used  . Alcohol use Yes     Comment: one beer occasionally  . Drug use: No  . Sexual activity: Yes   Other Topics Concern  . Not on file   Social History Narrative  . No narrative on file    REVIEW OF SYSTEMS: Constitutional: No fevers, chills, or sweats, no generalized fatigue, change in appetite Eyes: No visual changes, double vision, eye pain Ear, nose and throat: No hearing loss, ear pain, nasal congestion, sore throat Cardiovascular: No chest pain, palpitations Respiratory:  No shortness of breath at rest or with exertion, wheezes GastrointestinaI: No nausea, vomiting, diarrhea, abdominal pain, fecal incontinence Genitourinary:  No dysuria, urinary retention or frequency Musculoskeletal:  No neck pain, back pain Integumentary: No rash, pruritus, skin lesions Neurological: as above Psychiatric: No depression, insomnia, anxiety Endocrine: No palpitations, fatigue, diaphoresis, mood swings, change in appetite, change in weight, increased thirst Hematologic/Lymphatic:  No anemia, purpura, petechiae. Allergic/Immunologic: no itchy/runny eyes, nasal congestion, recent allergic reactions, rashes  PHYSICAL EXAM: Vitals:   11/18/15 1032  BP: 124/70  Pulse: 76  Temp: 98.2 F (36.8 C)   General: No acute distress Head:  Normocephalic/atraumatic Eyes: Fundoscopic exam shows bilateral sharp discs, no vessel changes, exudates, or hemorrhages Neck: supple, no paraspinal tenderness, full range of motion Back: No paraspinal tenderness Heart: regular rate and rhythm Lungs: Clear to auscultation bilaterally. Vascular: No carotid bruits. Skin/Extremities: No rash, no edema Neurological Exam: Mental status: alert and oriented to person, place, and time, no dysarthria or aphasia, Fund of  knowledge is appropriate.  Recent and remote memory are intact.  Attention and concentration are normal.    Able to name objects and repeat phrases. Cranial nerves: CN I: not tested CN II: pupils equal, round and reactive to light, visual fields intact, fundi unremarkable. CN III, IV, VI:  full range of motion, no nystagmus, no ptosis CN V: facial sensation intact CN VII: upper and lower face symmetric CN VIII: hearing intact to finger rub CN IX, X: gag intact, uvula midline CN XI: sternocleidomastoid and trapezius muscles intact CN XII: tongue midline Bulk & Tone: normal, no fasciculations. Motor: 5/5 throughout with no pronator drift. Sensation: decreased vibration to ankles bilaterally, otherwise intact to light touch, cold, pin, and joint position sense.  No extinction to double simultaneous stimulation.  Romberg test negative Deep Tendon Reflexes: +2 throughout, no ankle clonus Plantar responses: downgoing bilaterally Cerebellar: no incoordination on finger to nose testing Gait: narrow-based and steady, able to tandem walk adequately. Tremor: none  IMPRESSION: This is a pleasant 50 year old man with no significant past medical history presenting with paresthesias in the  face, hands, and feet, as well as constant numbness in his right leg. The etiology of his symptoms is unclear, his neurological exam is non-focal, with very mild decreased vibration sense to the ankles bilaterally, raising the possibility of neuropathy. The hand paresthesias are suggestive of carpal tunnel syndrome, and the right leg symptoms potentially a radiculopathy. The facial paresthesias are unclear, MRI brain with and without contrast will be ordered to assess for underlying structural abnormality. He is also reporting "twitching," which on further questioning is more of a restless feeling, possibly restless limb syndrome. Bloodwork for ferritin, ANA, ESR, CRP, SPEP/IFE will be ordered. Depending on EMG results, we  may also do imaging of the cervical and lumbar spine. He will follow-up in 2 months.   Thank you for allowing me to participate in the care of this patient. Please do not hesitate to call for any questions or concerns.   Ellouise Newer, M.D.  CC: Dr. Martinique

## 2015-11-18 NOTE — Patient Instructions (Addendum)
1. Schedule EMG/NCV of the both UE and LE with Dr. Posey Pronto 2. Bloodwork for ferritin, ANA, ESR, CRP, SPEP/IFE 3. We will plan for MRI brain with and without contrast, and will discuss if spine imaging is needed after the nerve test 4. Follow-up in 2 months

## 2015-11-19 LAB — ANTI-NUCLEAR AB-TITER (ANA TITER)

## 2015-11-19 LAB — ANA: Anti Nuclear Antibody(ANA): POSITIVE — AB

## 2015-11-22 LAB — IMMUNOFIXATION ELECTROPHORESIS
IGG (IMMUNOGLOBIN G), SERUM: 1275 mg/dL (ref 694–1618)
IgA: 295 mg/dL (ref 81–463)
IgM, Serum: 128 mg/dL (ref 48–271)

## 2015-11-22 LAB — PROTEIN ELECTROPHORESIS, SERUM
ALBUMIN ELP: 4.5 g/dL (ref 3.8–4.8)
ALPHA-1-GLOBULIN: 0.3 g/dL (ref 0.2–0.3)
ALPHA-2-GLOBULIN: 0.5 g/dL (ref 0.5–0.9)
BETA 2: 0.4 g/dL (ref 0.2–0.5)
Beta Globulin: 0.5 g/dL (ref 0.4–0.6)
GAMMA GLOBULIN: 1.3 g/dL (ref 0.8–1.7)
Total Protein, Serum Electrophoresis: 7.4 g/dL (ref 6.1–8.1)

## 2015-11-25 ENCOUNTER — Encounter: Payer: Self-pay | Admitting: Neurology

## 2015-11-26 ENCOUNTER — Telehealth: Payer: Self-pay | Admitting: Neurology

## 2015-11-26 NOTE — Telephone Encounter (Signed)
Spoke to patient, discussed normal bloodwork except ANA showing 1:80 titer, nonspecific, would proceed with EMG first. 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 Discussed how EMG would be helpful, we will call him with results after.

## 2015-12-02 ENCOUNTER — Ambulatory Visit (INDEPENDENT_AMBULATORY_CARE_PROVIDER_SITE_OTHER): Payer: 59 | Admitting: Neurology

## 2015-12-02 DIAGNOSIS — R202 Paresthesia of skin: Secondary | ICD-10-CM

## 2015-12-02 DIAGNOSIS — R200 Anesthesia of skin: Secondary | ICD-10-CM | POA: Diagnosis not present

## 2015-12-02 NOTE — Procedures (Addendum)
[facility]  [place], [place]  [place], [place] [address] Tel: [phone] Fax:  [phone] Test Date:  12/02/2015  Patient: Cody [name] DOB: 08-30-1965 Physician: Narda Amber, DO  Sex: Male Height: 5\' 7"  Ref Phys: Dr Delice Lesch  ID#: [id number] Temp: 32.2C Technician: Jerilynn Mages. Dean   Patient Complaints: This is a 50 year old gentleman referred for evaluation of generalized paresthesias of the arms and legs as well as muscle twitching.  NCV & EMG Findings: Extensive electrodiagnostic testing of the right upper and lower extremity shows: 1. All sensory responses including the right median, ulnar, palmar, sural, and superficial peroneal nerves are within normal limits. 2. All motor responses including the right median, ulnar, peroneal, and tibial nerves are within normal limits. 3. There is no evidence of active or chronic motor axon loss changes affecting any of the tested muscles. Motor unit configuration and recruitment pattern is within normal limits.  Impression: This is a normal study of the right upper and lower extremities.  In particular, there is no evidence of a generalized sensorimotor polyneuropathy, cervical/lumbosacral radiculopathy, diffuse myopathy, or carpal tunnel syndrome.   ___________________________ Narda Amber, DO    Nerve Conduction Studies Anti Sensory Summary Table   Site NR Peak (ms) Norm Peak (ms) P-T Amp (V) Norm P-T Amp  Right Median Anti Sensory (2nd Digit)  32.2C  Wrist    3.3 <3.4 37.6 >20  Right Sup Peroneal Anti Sensory (Ant Lat Mall)  12 cm    3.6 <4.5 7.4 >5  Right Sural Anti Sensory (Lat Mall)  Calf    3.5 <4.5 16.9 >5  Right Ulnar Anti Sensory (5th Digit)  32.2C  Wrist    2.9 <3.1 16.5 >12   Motor Summary Table   Site NR Onset (ms) Norm Onset (ms) O-P Amp (mV) Norm O-P Amp Site1 Site2 Delta-0 (ms) Dist (cm) Vel (m/s) Norm Vel (m/s)  Right Median Motor (Abd Poll Brev)  32.2C  Wrist    3.2 <3.9 9.9 >6 Elbow Wrist  4.1 26.0 63 >50  Elbow    7.3  9.1         Right Peroneal Motor (Ext Dig Brev)  Ankle    3.4 <5.5 5.1 >3 B Fib Ankle 7.0 33.0 47 >40  B Fib    10.4  4.9  Poplt B Fib 1.7 10.0 59 >40  Poplt    12.1  4.7         Right Tibial Motor (Abd Hall Brev)  Ankle    2.7 <6.0 8.3 >8 Knee Ankle 8.6 39.0 45 >40  Knee    11.3  6.1         Right Ulnar Motor (Abd Dig Minimi)  32.2C  Wrist    2.5 <3.1 8.6 >7 B Elbow Wrist 3.3 23.0 70 >50  B Elbow    5.8  7.8  A Elbow B Elbow 1.4 10.0 71 >50  A Elbow    7.2  7.2            Stim Site NR Peak (ms) Norm Peak (ms) P-T Amp (V) Site1 Site2 Delta-P (ms) Norm Delta (ms)  Right Median/Ulnar Palm Comparison (Wrist - 8cm)  32.2C  Median Palm    2.0 <2.2 62.4 Median Palm Ulnar Palm 0.1   Ulnar Palm    1.9 <2.2 46.0       EMG   Side Muscle Ins Act Fibs Psw Fasc Number Recrt Dur Dur. Amp Amp. Poly Poly. Comment  Right 1stDorInt  Nml Nml Nml Nml Nml Nml Nml Nml Nml Nml Nml Nml N/A  Right Ext Indicis Nml Nml Nml Nml Nml Nml Nml Nml Nml Nml Nml Nml N/A  Right PronatorTeres Nml Nml Nml Nml Nml Nml Nml Nml Nml Nml Nml Nml N/A  Right Biceps Nml Nml Nml Nml Nml Nml Nml Nml Nml Nml Nml Nml N/A  Right Triceps Nml Nml Nml Nml Nml Nml Nml Nml Nml Nml Nml Nml N/A  Right Deltoid Nml Nml Nml Nml Nml Nml Nml Nml Nml Nml Nml Nml N/A  Right AntTibialis Nml Nml Nml Nml Nml Nml Nml Nml Nml Nml Nml Nml N/A  Right Gastroc Nml Nml Nml Nml Nml Nml Nml Nml Nml Nml Nml Nml N/A  Right Flex Dig Long Nml Nml Nml Nml Nml Nml Nml Nml Nml Nml Nml Nml N/A  Right RectFemoris Nml Nml Nml Nml Nml Nml Nml Nml Nml Nml Nml Nml N/A  Right GluteusMed Nml Nml Nml Nml Nml Nml Nml Nml Nml Nml Nml Nml N/A  Right BicepsFemS Nml Nml Nml Nml Nml Nml Nml Nml Nml Nml Nml Nml N/A      Waveforms:

## 2015-12-07 ENCOUNTER — Other Ambulatory Visit: Payer: Self-pay

## 2015-12-07 ENCOUNTER — Telehealth: Payer: Self-pay | Admitting: Neurology

## 2015-12-07 DIAGNOSIS — M549 Dorsalgia, unspecified: Secondary | ICD-10-CM

## 2015-12-07 DIAGNOSIS — R200 Anesthesia of skin: Secondary | ICD-10-CM

## 2015-12-07 DIAGNOSIS — R202 Paresthesia of skin: Principal | ICD-10-CM

## 2015-12-07 NOTE — Telephone Encounter (Signed)
Discussed normal EMG/NCV with patient. Discussed proceeding with MRI.   Samantha, pls order the following for MRI under sedation: 1. MRI brain with and without contrast, Dx: numbness and tingling, r/o MS 2. MRI cervical spine with and without contrast: Dx: numbness and tingling, r/o MS 3. MRI lumbar spine with and without contrast: Dx: numbness, tingling, back pain  Thanks

## 2015-12-07 NOTE — Telephone Encounter (Signed)
Contacted patient with scheduled date and time. This does not work with his schedule so gave him number to reschedule.

## 2015-12-07 NOTE — Telephone Encounter (Signed)
LVM for patient to return my call. Have MRI scheduled at [facility] Nov 9th at 8:00AM. Patient will need a driver and someone from [facility] will call him with what time he will need to arrive.

## 2015-12-13 ENCOUNTER — Ambulatory Visit: Payer: 59 | Admitting: Neurology

## 2015-12-23 ENCOUNTER — Other Ambulatory Visit (HOSPITAL_COMMUNITY): Payer: 59

## 2015-12-23 ENCOUNTER — Ambulatory Visit (HOSPITAL_COMMUNITY): Payer: 59

## 2016-01-09 NOTE — Pre-Procedure Instructions (Signed)
    Cody [name]  01/09/2016      [facility] [address] [place] [address] Phone: [phone] Fax: [phone]    Your procedure is scheduled on Nov 30  Report to [facility] at 615 A.M.  Call this number if you have problems the morning of surgery:  [phone]   Remember:  Do not eat food or drink liquids after midnight.  Take these medicines the morning of surgery with A SIP OF WATER Loratadine (Claritin) if needed    Do not wear jewelry, make-up or nail polish.  Do not wear lotions, powders, or perfumes, or deoderant.  Do not shave 48 hours prior to surgery.  Men may shave face and neck.  Do not bring valuables to the hospital.  [facility] is not responsible for any belongings or valuables.  Contacts, dentures or bridgework may not be worn into surgery.  Leave your suitcase in the car.  After surgery it may be brought to your room.  For patients admitted to the hospital, discharge time will be determined by your treatment team.  Patients discharged the day of surgery will not be allowed to drive home.     Please read over the following fact sheets that you were given. Surgical Site Infection Prevention

## 2016-01-10 ENCOUNTER — Encounter (HOSPITAL_COMMUNITY)
Admission: RE | Admit: 2016-01-10 | Discharge: 2016-01-10 | Disposition: A | Discharge status: Home / Self Care | Payer: 59 | Source: Ambulatory Visit | Attending: Neurology | Admitting: Neurology

## 2016-01-10 ENCOUNTER — Encounter (HOSPITAL_COMMUNITY): Payer: Self-pay

## 2016-01-10 DIAGNOSIS — Z880 Allergy status to penicillin: Secondary | ICD-10-CM | POA: Diagnosis not present

## 2016-01-10 DIAGNOSIS — R202 Paresthesia of skin: Secondary | ICD-10-CM | POA: Diagnosis not present

## 2016-01-10 DIAGNOSIS — Z91048 Other nonmedicinal substance allergy status: Secondary | ICD-10-CM | POA: Diagnosis not present

## 2016-01-10 DIAGNOSIS — R200 Anesthesia of skin: Secondary | ICD-10-CM | POA: Diagnosis not present

## 2016-01-10 DIAGNOSIS — M5126 Other intervertebral disc displacement, lumbar region: Secondary | ICD-10-CM | POA: Diagnosis not present

## 2016-01-10 DIAGNOSIS — M5137 Other intervertebral disc degeneration, lumbosacral region: Secondary | ICD-10-CM | POA: Diagnosis not present

## 2016-01-10 DIAGNOSIS — M50222 Other cervical disc displacement at C5-C6 level: Secondary | ICD-10-CM | POA: Diagnosis not present

## 2016-01-10 HISTORY — DX: Other complications of anesthesia, initial encounter: T88.59XA

## 2016-01-10 HISTORY — DX: Adverse effect of unspecified anesthetic, initial encounter: T41.45XA

## 2016-01-10 HISTORY — DX: Family history of other specified conditions: Z84.89

## 2016-01-10 NOTE — Progress Notes (Signed)
PCP Dr. Martinique. No cardiologist, patient denies chest pain, shortness of breath. Has never had an echo, stress test, or cardiac cath.

## 2016-01-10 NOTE — Progress Notes (Signed)
Called Dr. Amparo Bristol office and spoke with Aldona Bar regarding need for a current history and physical. (needs to be within 30 days)

## 2016-01-11 ENCOUNTER — Encounter: Payer: Self-pay | Admitting: Neurology

## 2016-01-11 ENCOUNTER — Ambulatory Visit (INDEPENDENT_AMBULATORY_CARE_PROVIDER_SITE_OTHER): Payer: 59 | Admitting: Neurology

## 2016-01-11 VITALS — BP 118/76 | HR 75 | Wt 213.1 lb

## 2016-01-11 DIAGNOSIS — R202 Paresthesia of skin: Secondary | ICD-10-CM | POA: Diagnosis not present

## 2016-01-11 DIAGNOSIS — R200 Anesthesia of skin: Secondary | ICD-10-CM | POA: Diagnosis not present

## 2016-01-11 NOTE — Patient Instructions (Signed)
Proceed with MRI as scheduled. Follow-up after MRI.

## 2016-01-11 NOTE — Progress Notes (Signed)
NEUROLOGY FOLLOW UP OFFICE NOTE  AXXEL GUDE [id number]  HISTORY OF PRESENT ILLNESS: I had the pleasure of seeing Cody [name] in follow-up in the neurology clinic on 01/11/2016.  The patient was last seen a month ago for paresthesias in his face and extremities. He was also reporting a restless feeling in his limbs, as well as back pain. EMG/NCV of the right upper and lower extremities was normal. Bloodwork for neuropathy was overall unremarkable (ESR, CRP, SPEP/IFE). ANA was postivie with nucleolar patter 1:80 titer, equivocal. Ferritin level was normal. He presents today for an updated H&P prior to MRI under sedation. He continues to have the restless feeling in his legs, but reports that while he was in [place], it seemed less bothersome. He denies any headaches, dizziness, shortness of breath, chest pain, bowel/bladder dysfunction.   HPI 11/18/2015: This is a pleasant 50 year old man with no significant past medical history, in his usual state of health until a year ago. While they were in the process of moving to [facility], he had some mid-back pain bothering him for 1-2 weeks that resolved. When he drove down here, his right leg down to his ankle started becoming numb with pins and needles sensation. This has been constant for the past 15 months. In mid-October, his mid-back again started bothering him for 1-2 weeks then resolved. In March/April 2017, he started noticing intermittent numbness and tingling on his face (both sides), "like not getting enough blood." There was no associated headache or dizziness. He noticed he would wake up with both hands being numb, resolving after 1-2 minutes of shaking his hands. Around 6 months ago, he started having an electrical sensation in the toes of both feet (similar to how a TENS unit feels like), as well as burning pain in the heel of the left foot. Around 4 months ago, most worrisome for him is what he calls "twitching" in his shoulders, hips, legs, but  when asked to describe it, symptoms are more suggestive of a restless sensation where he feels like there are bees in his face/body and he has to move them. He feels these more in his legs at night, and feels better when he walks. He feels relief when he moves his shoulders. He denies any neck pain, and no back pain at this time, no bowel/bladder dysfunction. He drinks alcohol socially. He feels his cognition is a little different, he is good in Math but has noticed he has "lost a little bit." He denies any diplopia, dysarthria/dysphagia. His equilibrium has been off since he had an inner ear infection many years ago. He denies any recent infections, head injuries, no known family history of similar symptoms (he is adopted).  His PCP ordered bloodwork, with normal CBC, CMP, HbA1c, B12, TSH. Negative RPR, HIV, Lyme Ab. He had a very difficult time doing an open MRI and was unable to complete it. He does not want to take PO sedatives for the MRI.   PAST MEDICAL HISTORY: Past Medical History:  Diagnosis Date  . Complication of anesthesia    dizzy, difficult time waking up  . Family history of adverse reaction to anesthesia    unsure, patient was adopted    MEDICATIONS: Current Outpatient Prescriptions on File Prior to Visit  Medication Sig Dispense Refill  . ibuprofen (ADVIL,MOTRIN) 200 MG tablet Take 400-600 mg by mouth every 6 (six) hours as needed for mild pain (DEPENDS ON PAIN).    Marland Kitchen loratadine (CLARITIN) 10 MG tablet Take  10 mg by mouth daily as needed for allergies.     No current facility-administered medications on file prior to visit.     ALLERGIES: Allergies  Allergen Reactions  . Dust Mite Mixed Allergen Ext [Mite (D. Farinae)]   . Penicillins     Has patient had a PCN reaction causing immediate rash, facial/tongue/throat swelling, SOB or lightheadedness with hypotension: No Has patient had a PCN reaction causing severe rash involving mucus membranes or skin necrosis: No Has  patient had a PCN reaction that required hospitalization No Has patient had a PCN reaction occurring within the last 10 years: No If all of the above answers are "NO", then may proceed with Cephalosporin use.     FAMILY HISTORY: Family History  Problem Relation Age of Onset  . Adopted: Yes    SOCIAL HISTORY: Social History   Social History  . Marital status: Married    Spouse name: N/A  . Number of children: N/A  . Years of education: N/A   Occupational History  . Not on file.   Social History Main Topics  . Smoking status: Never Smoker  . Smokeless tobacco: Never Used  . Alcohol use Yes     Comment: one beer occasionally  . Drug use: No  . Sexual activity: Yes   Other Topics Concern  . Not on file   Social History Narrative  . No narrative on file    REVIEW OF SYSTEMS: Constitutional: No fevers, chills, or sweats, no generalized fatigue, change in appetite Eyes: No visual changes, double vision, eye pain Ear, nose and throat: No hearing loss, ear pain, nasal congestion, sore throat Cardiovascular: No chest pain, palpitations Respiratory:  No shortness of breath at rest or with exertion, wheezes GastrointestinaI: No nausea, vomiting, diarrhea, abdominal pain, fecal incontinence Genitourinary:  No dysuria, urinary retention or frequency Musculoskeletal:  No neck pain,+ back pain Integumentary: No rash, pruritus, skin lesions Neurological: as above Psychiatric: No depression, insomnia, anxiety Endocrine: No palpitations, fatigue, diaphoresis, mood swings, change in appetite, change in weight, increased thirst Hematologic/Lymphatic:  No anemia, purpura, petechiae. Allergic/Immunologic: no itchy/runny eyes, nasal congestion, recent allergic reactions, rashes  PHYSICAL EXAM: Vitals:   01/11/16 1149  BP: 118/76  Pulse: 75   General: No acute distress Head:  Normocephalic/atraumatic Neck: supple, no paraspinal tenderness, full range of motion Heart:  Regular  rate and rhythm Lungs:  Clear to auscultation bilaterally Back: No paraspinal tenderness Skin/Extremities: No rash, no edema Neurological Exam: alert and oriented to person, place, and time. No aphasia or dysarthria. Fund of knowledge is appropriate.  Recent and remote memory are intact.  Attention and concentration are normal.    Able to name objects and repeat phrases. Cranial nerves: Pupils equal, round, reactive to light.  Extraocular movements intact with no nystagmus. Visual fields full. Facial sensation intact. No facial asymmetry. Tongue, uvula, palate midline.  Motor: Bulk and tone normal, muscle strength 5/5 throughout with no pronator drift.  Sensation to light touch intact.  No extinction to double simultaneous stimulation.  Deep tendon reflexes 2+ throughout, toes downgoing.  Finger to nose testing intact.  Gait narrow-based and steady, able to tandem walk adequately.  Romberg negative.  IMPRESSION: This is a pleasant 50 yo RH man with no significant past medical history with paresthesias in the face, hands, and feet, as well as constant numbness in his right leg. The etiology of his symptoms is unclear, his neurological exam is non-focal, with very mild decreased vibration sense to the ankles  bilaterally, raising the possibility of neuropathy. EMG/NCV of the right upper and lower extremities was normal. He is scheduled for an MRI brain and spine to assess for any demyelinating lesions that could potentially cause paresthesias in this age group. He is also reporting "twitching," which on further questioning is more of a restless feeling, possibly restless limb syndrome. He will follow-up after the MRI.   Thank you for allowing me to participate in his care.  Please do not hesitate to call for any questions or concerns.  The duration of this appointment visit was 15 minutes of face-to-face time with the patient.  Greater than 50% of this time was spent in counseling, explanation of diagnosis,  planning of further management, and coordination of care.   Cody [name], M.D.   CC: Dr. Martinique

## 2016-01-13 ENCOUNTER — Ambulatory Visit (HOSPITAL_COMMUNITY)
Admission: RE | Admit: 2016-01-13 | Discharge: 2016-01-13 | Disposition: A | Discharge status: Home / Self Care | Payer: 59 | Source: Ambulatory Visit | Attending: Neurology | Admitting: Neurology

## 2016-01-13 ENCOUNTER — Ambulatory Visit (HOSPITAL_COMMUNITY): Payer: 59

## 2016-01-13 ENCOUNTER — Ambulatory Visit (HOSPITAL_COMMUNITY): Payer: 59 | Admitting: Certified Registered Nurse Anesthetist

## 2016-01-13 ENCOUNTER — Encounter (HOSPITAL_COMMUNITY): Payer: Self-pay | Admitting: *Deleted

## 2016-01-13 ENCOUNTER — Encounter (HOSPITAL_COMMUNITY)
Admission: RE | Disposition: A | Discharge status: Home / Self Care | Payer: Self-pay | Source: Ambulatory Visit | Attending: Neurology

## 2016-01-13 DIAGNOSIS — R200 Anesthesia of skin: Secondary | ICD-10-CM | POA: Diagnosis not present

## 2016-01-13 DIAGNOSIS — Z91048 Other nonmedicinal substance allergy status: Secondary | ICD-10-CM | POA: Diagnosis not present

## 2016-01-13 DIAGNOSIS — M5136 Other intervertebral disc degeneration, lumbar region: Secondary | ICD-10-CM | POA: Diagnosis not present

## 2016-01-13 DIAGNOSIS — M50222 Other cervical disc displacement at C5-C6 level: Secondary | ICD-10-CM | POA: Insufficient documentation

## 2016-01-13 DIAGNOSIS — R202 Paresthesia of skin: Secondary | ICD-10-CM | POA: Insufficient documentation

## 2016-01-13 DIAGNOSIS — Z880 Allergy status to penicillin: Secondary | ICD-10-CM | POA: Insufficient documentation

## 2016-01-13 DIAGNOSIS — M5137 Other intervertebral disc degeneration, lumbosacral region: Secondary | ICD-10-CM | POA: Insufficient documentation

## 2016-01-13 DIAGNOSIS — M549 Dorsalgia, unspecified: Secondary | ICD-10-CM | POA: Diagnosis not present

## 2016-01-13 DIAGNOSIS — G2581 Restless legs syndrome: Secondary | ICD-10-CM | POA: Diagnosis not present

## 2016-01-13 DIAGNOSIS — M5126 Other intervertebral disc displacement, lumbar region: Secondary | ICD-10-CM | POA: Diagnosis not present

## 2016-01-13 HISTORY — PX: RADIOLOGY WITH ANESTHESIA: SHX6223

## 2016-01-13 HISTORY — DX: Other specified health status: Z78.9

## 2016-01-13 SURGERY — RADIOLOGY WITH ANESTHESIA
Anesthesia: General

## 2016-01-13 MED ORDER — LIDOCAINE HCL (CARDIAC) 20 MG/ML IV SOLN
INTRAVENOUS | Status: DC | PRN
  Administered 2016-01-13: 60 mg via INTRAVENOUS

## 2016-01-13 MED ORDER — MIDAZOLAM HCL 5 MG/5ML IJ SOLN
INTRAMUSCULAR | Status: DC | PRN
  Administered 2016-01-13 (×2): 1 mg via INTRAVENOUS

## 2016-01-13 MED ORDER — ONDANSETRON HCL 4 MG/2ML IJ SOLN
INTRAMUSCULAR | Status: DC | PRN
Start: 2016-01-13 — End: 2016-01-13
  Administered 2016-01-13: 4 mg via INTRAVENOUS

## 2016-01-13 MED ORDER — FENTANYL CITRATE (PF) 100 MCG/2ML IJ SOLN
INTRAMUSCULAR | Status: DC | PRN
  Administered 2016-01-13 (×2): 25 ug via INTRAVENOUS
  Administered 2016-01-13: 50 ug via INTRAVENOUS

## 2016-01-13 MED ORDER — PROMETHAZINE HCL 25 MG/ML IJ SOLN: 6.2500 mg | INTRAMUSCULAR | Status: DC | PRN

## 2016-01-13 MED ORDER — DEXAMETHASONE SODIUM PHOSPHATE 10 MG/ML IJ SOLN
INTRAMUSCULAR | Status: DC | PRN
  Administered 2016-01-13: 10 mg via INTRAVENOUS

## 2016-01-13 MED ORDER — FENTANYL CITRATE (PF) 100 MCG/2ML IJ SOLN: 25.0000 ug | INTRAMUSCULAR | Status: DC | PRN

## 2016-01-13 MED ORDER — GADOBENATE DIMEGLUMINE 529 MG/ML IV SOLN
20.0000 mL | Freq: Once | INTRAVENOUS | Status: AC
  Administered 2016-01-13: 20 mL via INTRAVENOUS

## 2016-01-13 MED ORDER — LACTATED RINGERS IV SOLN
INTRAVENOUS | Status: DC
  Administered 2016-01-13 (×3): via INTRAVENOUS

## 2016-01-13 MED ORDER — PROPOFOL 10 MG/ML IV BOLUS
INTRAVENOUS | Status: DC | PRN
  Administered 2016-01-13: 200 mg via INTRAVENOUS

## 2016-01-13 NOTE — Anesthesia Preprocedure Evaluation (Signed)
Anesthesia Evaluation  Patient identified by MRN, date of birth, ID band Patient awake    Reviewed: Allergy & Precautions, NPO status , Patient's Chart, lab work & pertinent test results  Airway Mallampati: II  TM Distance: >3 FB Neck ROM: Full    Dental no notable dental hx.    Pulmonary neg pulmonary ROS,    Pulmonary exam normal breath sounds clear to auscultation       Cardiovascular negative cardio ROS Normal cardiovascular exam Rhythm:Regular Rate:Normal     Neuro/Psych negative neurological ROS  negative psych ROS   GI/Hepatic negative GI ROS, Neg liver ROS,   Endo/Other  negative endocrine ROS  Renal/GU negative Renal ROS  negative genitourinary   Musculoskeletal negative musculoskeletal ROS (+)   Abdominal   Peds negative pediatric ROS (+)  Hematology negative hematology ROS (+)   Anesthesia Other Findings   Reproductive/Obstetrics negative OB ROS                             Anesthesia Physical Anesthesia Plan  ASA: II  Anesthesia Plan: General   Post-op Pain Management:    Induction: Intravenous  Airway Management Planned: LMA  Additional Equipment:   Intra-op Plan:   Post-operative Plan:   Informed Consent: I have reviewed the patients History and Physical, chart, labs and discussed the procedure including the risks, benefits and alternatives for the proposed anesthesia with the patient or authorized representative who has indicated his/her understanding and acceptance.   Dental advisory given  Plan Discussed with: CRNA and Surgeon  Anesthesia Plan Comments:         Anesthesia Quick Evaluation

## 2016-01-13 NOTE — Anesthesia Postprocedure Evaluation (Signed)
Anesthesia Post Note  Patient: Cody [name]  Procedure(s) Performed: Procedure(s) (LRB): MRI of brain with and without and cervical spine and lumbar spine (N/A)  Patient location during evaluation: PACU Anesthesia Type: General Level of consciousness: awake and alert Pain management: pain level controlled Vital Signs Assessment: post-procedure vital signs reviewed and stable Respiratory status: spontaneous breathing, nonlabored ventilation, respiratory function stable and patient connected to nasal cannula oxygen Cardiovascular status: blood pressure returned to baseline and stable Postop Assessment: no signs of nausea or vomiting Anesthetic complications: no    Last Vitals:  Vitals:   01/13/16 1145 01/13/16 1207  BP: 138/86 120/85  Pulse:  87  Resp:  16  Temp:      Last Pain:  Vitals:   01/13/16 1207  TempSrc:   PainSc: [name]

## 2016-01-13 NOTE — Transfer of Care (Signed)
Immediate Anesthesia Transfer of Care Note  Patient: NETHANEL STELTENPOHL  Procedure(s) Performed: Procedure(s): MRI of brain with and without and cervical spine and lumbar spine (N/A)  Patient Location: PACU  Anesthesia Type:General  Level of Consciousness: awake  Airway & Oxygen Therapy: Patient Spontanous Breathing and Patient connected to nasal cannula oxygen  Post-op Assessment: Report given to RN, Post -op Vital signs reviewed and stable and Patient moving all extremities X 4  Post vital signs: Reviewed and stable  Last Vitals:  Vitals:   01/13/16 0651 01/13/16 1122  BP: (!) 163/64 111/67  Pulse: 68 83  Resp: 18 18  Temp: 36.5 C 36.9 C    Last Pain:  Vitals:   01/13/16 1122  TempSrc:   PainSc: 0-No pain         Complications: No apparent anesthesia complications

## 2016-01-14 ENCOUNTER — Encounter (HOSPITAL_COMMUNITY): Payer: Self-pay | Admitting: Radiology

## 2016-01-14 MED FILL — Propofol IV Emul 200 MG/20ML (10 MG/ML): INTRAVENOUS | Qty: 20 | Status: AC

## 2016-01-14 MED FILL — Dexamethasone Sodium Phosphate Inj 10 MG/ML: INTRAMUSCULAR | Qty: 1 | Status: AC

## 2016-01-14 MED FILL — Fentanyl Citrate Preservative Free (PF) Inj 100 MCG/2ML: INTRAMUSCULAR | Qty: 2 | Status: AC

## 2016-01-14 MED FILL — Ondansetron HCl Inj 4 MG/2ML (2 MG/ML): INTRAMUSCULAR | Qty: 2 | Status: AC

## 2016-01-14 MED FILL — Lactated Ringer's Solution: INTRAVENOUS | Qty: 1000 | Status: AC

## 2016-01-14 MED FILL — Lidocaine HCl Local Soln Prefilled Syringe 100 MG/5ML (2%): INTRAMUSCULAR | Qty: 5 | Status: AC

## 2016-01-14 MED FILL — Midazolam HCl Inj 2 MG/2ML (Base Equivalent): INTRAMUSCULAR | Qty: 2 | Status: AC

## 2016-01-18 ENCOUNTER — Telehealth: Payer: Self-pay

## 2016-01-18 NOTE — Telephone Encounter (Signed)
Patient called Cody [name] on 01/14/16 at 12:08  AM . Initial Comment: He is having trouble urinating. Reason for call: Caller states he had a procedure today that he was under sedation and he has been having trouble urinating. He feels like his bladder is full and he is not urinating a full stream that has been going on for 4 hours. Care Advise Given: Go to ED now.  Disagree/Comply: Disagree. Reason: Wait and see.

## 2016-01-19 ENCOUNTER — Encounter: Payer: Self-pay | Admitting: Neurology

## 2016-01-19 ENCOUNTER — Ambulatory Visit (INDEPENDENT_AMBULATORY_CARE_PROVIDER_SITE_OTHER): Payer: 59 | Admitting: Neurology

## 2016-01-19 VITALS — BP 126/74 | HR 66 | Ht 66.5 in | Wt 212.1 lb

## 2016-01-19 DIAGNOSIS — G2581 Restless legs syndrome: Secondary | ICD-10-CM | POA: Diagnosis not present

## 2016-01-19 DIAGNOSIS — R202 Paresthesia of skin: Secondary | ICD-10-CM

## 2016-01-19 DIAGNOSIS — R200 Anesthesia of skin: Secondary | ICD-10-CM | POA: Diagnosis not present

## 2016-01-19 NOTE — Patient Instructions (Signed)
Continue to monitor symptoms, if they worsen to the point that you would consider medication, please call our office. Have a great holiday!

## 2016-01-19 NOTE — Progress Notes (Signed)
NEUROLOGY FOLLOW UP OFFICE NOTE  Cody [name] [id number]  HISTORY OF PRESENT ILLNESS: I had the pleasure of seeing Cody [name] in follow-up in the neurology clinic on 01/19/2016.  The patient was last seen a week ago and presents today to discuss MRI findings. I personally reviewed MRI brain, cervical, and lumbar spine, which did not show any acute changes. Brain MRI did not show any evidence of demyelination. His C-spine showed a C5-6 right paracentral disc protrusion with mild ventral cord flattening, no foraminal impingement. MRI lumbar spine showed L3-4 left foraminal protrusion with L3 impingement, L5-S1 degenerative disc disease, no impingement to explain right leg numbness. His EMG/NCV of the right upper and lower extremities was normal. Bloodwork for neuropathy was overall unremarkable (ESR, CRP, SPEP/IFE). ANA was postive with nucleolar patter 1:80 titer, equivocal. Ferritin level was normal. He continues to occasional tingling in his face, and continues to have the restless feeling in his arms and legs. He denies any headaches, dizziness, shortness of breath, chest pain, bowel/bladder dysfunction. No falls.  HPI 11/18/2015: This is a pleasant 50 year old man with no significant past medical history, in his usual state of health until a year ago. While they were in the process of moving to [facility], he had some mid-back pain bothering him for 1-2 weeks that resolved. When he drove down here, his right leg down to his ankle started becoming numb with pins and needles sensation. This has been constant for the past 15 months. In mid-October, his mid-back again started bothering him for 1-2 weeks then resolved. In March/April 2017, he started noticing intermittent numbness and tingling on his face (both sides), "like not getting enough blood." There was no associated headache or dizziness. He noticed he would wake up with both hands being numb, resolving after 1-2 minutes of shaking his hands.  Around 6 months ago, he started having an electrical sensation in the toes of both feet (similar to how a TENS unit feels like), as well as burning pain in the heel of the left foot. Around 4 months ago, most worrisome for him is what he calls "twitching" in his shoulders, hips, legs, but when asked to describe it, symptoms are more suggestive of a restless sensation where he feels like there are bees in his face/body and he has to move them. He feels these more in his legs at night, and feels better when he walks. He feels relief when he moves his shoulders. He denies any neck pain, and no back pain at this time, no bowel/bladder dysfunction. He drinks alcohol socially. He feels his cognition is a little different, he is good in Math but has noticed he has "lost a little bit." He denies any diplopia, dysarthria/dysphagia. His equilibrium has been off since he had an inner ear infection many years ago. He denies any recent infections, head injuries, no known family history of similar symptoms (he is adopted).  His PCP ordered bloodwork, with normal CBC, CMP, HbA1c, B12, TSH. Negative RPR, HIV, Lyme Ab. He had a very difficult time doing an open MRI and was unable to complete it. He does not want to take PO sedatives for the MRI.   PAST MEDICAL HISTORY: Past Medical History:  Diagnosis Date  . Complication of anesthesia    dizzy, difficult time waking up  . Family history of adverse reaction to anesthesia    unsure, patient was adopted  . Medical history non-contributory     MEDICATIONS: Current Outpatient Prescriptions on  File Prior to Visit  Medication Sig Dispense Refill  . ibuprofen (ADVIL,MOTRIN) 200 MG tablet Take 400-600 mg by mouth every 6 (six) hours as needed for mild pain (DEPENDS ON PAIN).    Marland Kitchen loratadine (CLARITIN) 10 MG tablet Take 10 mg by mouth daily as needed for allergies.     No current facility-administered medications on file prior to visit.     ALLERGIES: Allergies    Allergen Reactions  . Dust Mite Mixed Allergen Ext [Mite (D. Farinae)]   . Penicillins     Has patient had a PCN reaction causing immediate rash, facial/tongue/throat swelling, SOB or lightheadedness with hypotension: No Has patient had a PCN reaction causing severe rash involving mucus membranes or skin necrosis: No Has patient had a PCN reaction that required hospitalization No Has patient had a PCN reaction occurring within the last 10 years: No If all of the above answers are "NO", then may proceed with Cephalosporin use.     FAMILY HISTORY: Family History  Problem Relation Age of Onset  . Adopted: Yes    SOCIAL HISTORY: Social History   Social History  . Marital status: Married    Spouse name: N/A  . Number of children: N/A  . Years of education: N/A   Occupational History  . Not on file.   Social History Main Topics  . Smoking status: Never Smoker  . Smokeless tobacco: Never Used  . Alcohol use Yes     Comment: one beer occasionally  . Drug use: No  . Sexual activity: Yes   Other Topics Concern  . Not on file   Social History Narrative  . No narrative on file    REVIEW OF SYSTEMS: Constitutional: No fevers, chills, or sweats, no generalized fatigue, change in appetite Eyes: No visual changes, double vision, eye pain Ear, nose and throat: No hearing loss, ear pain, nasal congestion, sore throat Cardiovascular: No chest pain, palpitations Respiratory:  No shortness of breath at rest or with exertion, wheezes GastrointestinaI: No nausea, vomiting, diarrhea, abdominal pain, fecal incontinence Genitourinary:  No dysuria, urinary retention or frequency Musculoskeletal:  No neck pain,+ back pain Integumentary: No rash, pruritus, skin lesions Neurological: as above Psychiatric: No depression, insomnia, anxiety Endocrine: No palpitations, fatigue, diaphoresis, mood swings, change in appetite, change in weight, increased thirst Hematologic/Lymphatic:  No anemia,  purpura, petechiae. Allergic/Immunologic: no itchy/runny eyes, nasal congestion, recent allergic reactions, rashes  PHYSICAL EXAM: Vitals:   01/19/16 1336  BP: 126/74  Pulse: 66   General: No acute distress Head:  Normocephalic/atraumatic Skin/Extremities: No rash, no edema Neurological Exam: alert and oriented to person, place, and time. No aphasia or dysarthria. Fund of knowledge is appropriate.  Recent and remote memory are intact.  Attention and concentration are normal.    Able to name objects and repeat phrases. Cranial nerves: Pupils equal, round. No facial asymmetry. Tongue, uvula, palate midline.  Motor: moves extremities symmetrically. Gait narrow-based and steady  IMPRESSION: This is a pleasant 50 yo RH man with no significant past medical history with paresthesias in the face, hands, and feet, as well as constant numbness in his right leg. Extensive workup with MRI brain, cervical, and lumbar spine, did not show any acute changes. Brain MRI did not show any evidence of demyelination. His C-spine showed a C5-6 right paracentral disc protrusion with mild ventral cord flattening, no foraminal impingement. MRI lumbar spine showed L3-4 left foraminal protrusion with L3 impingement, L5-S1 degenerative disc disease. EMG/NCV of the right upper and lower  extremities was normal. There may be some anxiety component. The restless feeling he describes raises the possibility of restless limb syndrome, we discussed a trial of gabapentin which helps with paresthesias and RLS, he is very hesitant to start medication and declines at this time. He knows to call our office if symptoms change, and will follow-up on a prn basis.   Thank you for allowing me to participate in his care.  Please do not hesitate to call for any questions or concerns.  The duration of this appointment visit was 15 minutes of face-to-face time with the patient.  Greater than 50% of this time was spent in counseling, explanation of  diagnosis, planning of further management, and coordination of care.   Ellouise Newer, M.D.   CC: Dr. Martinique

## 2016-01-20 ENCOUNTER — Ambulatory Visit: Payer: 59 | Admitting: Neurology

## 2016-02-23 ENCOUNTER — Ambulatory Visit: Payer: 59 | Admitting: Neurology

## 2016-11-02 ENCOUNTER — Encounter: Payer: Self-pay | Admitting: Family Medicine

## 2017-04-20 NOTE — Progress Notes (Signed)
[name] [place], [place] [address] Phone: [phone] Subjective:    I'm seeing this patient by the request  of:    CC: Foot pain   LHT:[id number]  Cody [name] is a 52 y.o. male coming in with complaint of foot pain left foot pain. Chronic. Plantar fascia. Says he was a huge runner. Can't do anything due to pain. Every step is painful. Pain is radiating up the ankle. Has had an injection and wore a boot. States it didn't work. Says that when you squeeze your heel it increases pain. TTP at times.    Patient's previous imaging includes an MRI of the lumbar spine from January 13, 2016.  Found to have moderate degenerative disc disease at L5-S1 as well as an L3 nerve root impingement on the left side.  Past Medical History:  Diagnosis Date  . Complication of anesthesia    dizzy, difficult time waking up  . Family history of adverse reaction to anesthesia    unsure, patient was adopted  . Medical history non-contributory    Past Surgical History:  Procedure Laterality Date  . CHOLECYSTECTOMY    . HERNIA REPAIR    . NECK SURGERY     as a child  . RADIOLOGY WITH ANESTHESIA N/A 01/13/2016   Procedure: MRI of brain with and without and cervical spine and lumbar spine;  Surgeon: Medication Radiologist, MD;  Location: [facility];  Service: Radiology;  Laterality: N/A;   Social History   Socioeconomic History  . Marital status: Married    Spouse name: None  . Number of children: None  . Years of education: None  . Highest education level: None  Social Needs  . Financial resource strain: None  . Food insecurity - worry: None  . Food insecurity - inability: None  . Transportation needs - medical: None  . Transportation needs - non-medical: None  Occupational History  . None  Tobacco Use  . Smoking status: Never Smoker  . Smokeless tobacco: Never Used  Substance and Sexual Activity  . Alcohol use: Yes    Comment: one beer  occasionally  . Drug use: No  . Sexual activity: Yes  Other Topics Concern  . None  Social History Narrative  . None   Allergies  Allergen Reactions  . Dust Mite Mixed Allergen Ext [Mite (D. Farinae)]   . Penicillins     Has patient had a PCN reaction causing immediate rash, facial/tongue/throat swelling, SOB or lightheadedness with hypotension: No Has patient had a PCN reaction causing severe rash involving mucus membranes or skin necrosis: No Has patient had a PCN reaction that required hospitalization No Has patient had a PCN reaction occurring within the last 10 years: No If all of the above answers are "NO", then may proceed with Cephalosporin use.    Family History  Adopted: Yes     Past medical history, social, surgical and family history all reviewed in electronic medical record.  No pertanent information unless stated regarding to the chief complaint.   Review of Systems:Review of systems updated and as accurate as of 04/23/17  No headache, visual changes, nausea, vomiting, diarrhea, constipation, dizziness, abdominal pain, skin rash, fevers, chills, night sweats, weight loss, swollen lymph nodes, body aches, joint swelling, muscle aches, chest pain, shortness of breath, mood changes.   Objective  Blood pressure 106/82, pulse 70, height 5' 6.5" (1.689 m), weight 217 lb (98.4 kg), SpO2 97 %. Systems examined below as  of 04/23/17   General: No apparent distress alert and oriented x3 mood and affect normal, dressed appropriately.  HEENT: Pupils equal, extraocular movements intact  Respiratory: Patient's speak in full sentences and does not appear short of breath  Cardiovascular: No lower extremity edema, non tender, no erythema  Skin: Warm dry intact with no signs of infection or rash on extremities or on axial skeleton.  Abdomen: Soft nontender  Neuro: Cranial nerves II through XII are intact, neurovascularly intact in all extremities with 2+ DTRs and 2+ pulses.  Lymph:  No lymphadenopathy of posterior or anterior cervical chain or axillae bilaterally.  Gait normal with good balance and coordination.  MSK:  Non tender with full range of motion and good stability and symmetric strength and tone of shoulders, elbows, wrist, hip, knee and ankles bilaterally.  Left foot exam shows the patient does have more of a pes cavus.  Patient does have breakdown of the transverse arch.  Severely tender mostly on the plantar aspect of the calcaneal region proximally.  Neurovascular intact distally.  Full range of motion of the ankle.  MSK US performed of: Left foot This study was ordered, performed, and interpreted by Charlann Boxer D.O.  Foot/Ankle: Plantar fascia intact and with mild effusion.  Patient does have a positive Sign over the calcaneal region in this area with what appears to be an avulsion fracture.  Mildly displaced.  IMPRESSION: Nonhealing calcaneal avulsion fracture with reactive plantar fasciitis     Impression and Recommendations:     This case required medical decision making of moderate complexity.      Note: This dictation was prepared with Dragon dictation along with smaller phrase technology. Any transcriptional errors that result from this process are unintentional.

## 2017-04-23 ENCOUNTER — Ambulatory Visit: Payer: Self-pay

## 2017-04-23 ENCOUNTER — Ambulatory Visit (INDEPENDENT_AMBULATORY_CARE_PROVIDER_SITE_OTHER): Payer: No Typology Code available for payment source | Admitting: Family Medicine

## 2017-04-23 ENCOUNTER — Encounter: Payer: Self-pay | Admitting: Family Medicine

## 2017-04-23 VITALS — BP 106/82 | HR 70 | Ht 66.5 in | Wt 217.0 lb

## 2017-04-23 DIAGNOSIS — S92002A Unspecified fracture of left calcaneus, initial encounter for closed fracture: Secondary | ICD-10-CM | POA: Diagnosis not present

## 2017-04-23 DIAGNOSIS — M79672 Pain in left foot: Secondary | ICD-10-CM

## 2017-04-23 DIAGNOSIS — S92009A Unspecified fracture of unspecified calcaneus, initial encounter for closed fracture: Secondary | ICD-10-CM | POA: Insufficient documentation

## 2017-04-23 MED ORDER — VITAMIN D (ERGOCALCIFEROL) 1.25 MG (50000 UNIT) PO CAPS: 50000.0000 [IU] | ORAL_CAPSULE | ORAL | 0 refills | Status: DC

## 2017-04-23 MED ORDER — NITROGLYCERIN 0.2 MG/HR TD PT24: MEDICATED_PATCH | TRANSDERMAL | 1 refills | Status: DC

## 2017-04-23 NOTE — Patient Instructions (Signed)
Good to see you  You have an avulsion fracture and is why you cannot helel Boot for 2 weeks most of the time.  Come out of boot and move the ankle.  Ice 20 minutes 2 times daily. Usually after activity and before bed. Once weekly vitamin D for 12 weeks Nitroglycerin Protocol   Apply 1/4 nitroglycerin patch to affected area daily.  Change position of patch within the affected area every 24 hours.  You may experience a headache during the first 1-2 weeks of using the patch, these should subside.  If you experience headaches after beginning nitroglycerin patch treatment, you may take your preferred over the counter pain reliever.  Another side effect of the nitroglycerin patch is skin irritation or rash related to patch adhesive.  Please notify our office if you develop more severe headaches or rash, and stop the patch.  Tendon healing with nitroglycerin patch may require 12 to 24 weeks depending on the extent of injury.  Men should not use if taking Viagra, Cialis, or Levitra.   Do not use if you have migraines or rosacea.   Over the counter get K2 any dose for 1 month Heel donut in shoe and boot.  NO BAREFOOT AT ALL See me again in 3 weeks

## 2017-04-23 NOTE — Assessment & Plan Note (Signed)
Patient does have what appears to be more of a calcaneal fracture.  I believe that this is why the plantar fasciitis does not seem to be improving.  Discussed with patient about home exercise, icing regimen, topical anti-inflammatories.  Nitroglycerin once weekly vitamin D given.  Patient does have a Cam walker and will do a heel donut.  Patient will follow-up again in 3 weeks.

## 2017-05-14 ENCOUNTER — Other Ambulatory Visit: Payer: Self-pay | Admitting: *Deleted

## 2017-05-14 DIAGNOSIS — S92002A Unspecified fracture of left calcaneus, initial encounter for closed fracture: Secondary | ICD-10-CM

## 2017-05-15 ENCOUNTER — Ambulatory Visit: Payer: No Typology Code available for payment source | Admitting: Family Medicine

## 2017-05-22 ENCOUNTER — Other Ambulatory Visit: Payer: No Typology Code available for payment source

## 2017-06-12 ENCOUNTER — Ambulatory Visit
Admission: RE | Admit: 2017-06-12 | Discharge: 2017-06-12 | Disposition: A | Discharge status: Home / Self Care | Payer: No Typology Code available for payment source | Source: Ambulatory Visit | Attending: Family Medicine | Admitting: Family Medicine

## 2017-06-12 DIAGNOSIS — S92002A Unspecified fracture of left calcaneus, initial encounter for closed fracture: Secondary | ICD-10-CM

## 2017-07-10 ENCOUNTER — Ambulatory Visit: Payer: No Typology Code available for payment source | Admitting: Family Medicine

## 2017-07-17 NOTE — Progress Notes (Signed)
[name] [address] [place], [place] [address] Phone: [phone] Subjective:     CC: Partial calcaneal avulsion fracture follow-up  ALP:[id number]  LAIKEN NOHR is a 52 y.o. male coming in with complaint of heel pain.  Had been treated from an outside facility for a plantar fasciitis and was not making improvement.  Found to have more of a calcaneal avulsion fracture.  Patient has been doing once weekly vitamin D and nitroglycerin and states no improvement.  Patient states continues to have discomfort and pain.  Patient states that he religiously follows the directions of.  Patient has discussed the possibility of surgery with another provider as well.     Past Medical History:  Diagnosis Date  . Complication of anesthesia    dizzy, difficult time waking up  . Family history of adverse reaction to anesthesia    unsure, patient was adopted  . Medical history non-contributory    Past Surgical History:  Procedure Laterality Date  . CHOLECYSTECTOMY    . HERNIA REPAIR    . NECK SURGERY     as a child  . RADIOLOGY WITH ANESTHESIA N/A 01/13/2016   Procedure: MRI of brain with and without and cervical spine and lumbar spine;  Surgeon: Medication Radiologist, MD;  Location: [facility];  Service: Radiology;  Laterality: N/A;   Social History   Socioeconomic History  . Marital status: Married    Spouse name: Not on file  . Number of children: Not on file  . Years of education: Not on file  . Highest education level: Not on file  Occupational History  . Not on file  Social Needs  . Financial resource strain: Not on file  . Food insecurity:    Worry: Not on file    Inability: Not on file  . Transportation needs:    Medical: Not on file    Non-medical: Not on file  Tobacco Use  . Smoking status: Never Smoker  . Smokeless tobacco: Never Used  Substance and Sexual Activity  . Alcohol use: Yes    Comment: one beer occasionally  . Drug use: No  .  Sexual activity: Yes  Lifestyle  . Physical activity:    Days per week: Not on file    Minutes per session: Not on file  . Stress: Not on file  Relationships  . Social connections:    Talks on phone: Not on file    Gets together: Not on file    Attends religious service: Not on file    Active member of club or organization: Not on file    Attends meetings of clubs or organizations: Not on file    Relationship status: Not on file  Other Topics Concern  . Not on file  Social History Narrative  . Not on file   Allergies  Allergen Reactions  . Dust Mite Mixed Allergen Ext [Mite (D. Farinae)]   . Penicillins     Has patient had a PCN reaction causing immediate rash, facial/tongue/throat swelling, SOB or lightheadedness with hypotension: No Has patient had a PCN reaction causing severe rash involving mucus membranes or skin necrosis: No Has patient had a PCN reaction that required hospitalization No Has patient had a PCN reaction occurring within the last 10 years: No If all of the above answers are "NO", then may proceed with Cephalosporin use.    Family History  Adopted: Yes     Past medical history, social, surgical and family  history all reviewed in electronic medical record.  No pertanent information unless stated regarding to the chief complaint.   Review of Systems:Review of systems updated and as accurate as of 07/17/17  No headache, visual changes, nausea, vomiting, diarrhea, constipation, dizziness, abdominal pain, skin rash, fevers, chills, night sweats, weight loss, swollen lymph nodes, body aches, joint swelling,chest pain, shortness of breath, mood changes.  Positive muscle aches  Objective  There were no vitals taken for this visit. Systems examined below as of 07/17/17   General: No apparent distress alert and oriented x3 mood and affect normal, dressed appropriately.  HEENT: Pupils equal, extraocular movements intact  Respiratory: Patient's speak in full  sentences and does not appear short of breath  Cardiovascular: No lower extremity edema, non tender, no erythema  Skin: Warm dry intact with no signs of infection or rash on extremities or on axial skeleton.  Abdomen: Soft nontender  Neuro: Cranial nerves II through XII are intact, neurovascularly intact in all extremities with 2+ DTRs and 2+ pulses.  Lymph: No lymphadenopathy of posterior or anterior cervical chain or axillae bilaterally.  Gait normal with good balance and coordination.  MSK:  Non tender with full range of motion and good stability and symmetric strength and tone of shoulders, elbows, wrist, hip, knee bilaterally.  Ankle: Left No visible erythema or swelling. Range of motion is full in all directions. Strength is 5/5 in all directions. Stable lateral and medial ligaments; squeeze test and kleiger test unremarkable; Talar dome nontender; No pain at base of 5th MT; No tenderness over cuboid; No tenderness over N spot or navicular prominence No tenderness on posterior aspects of lateral and medial malleolus No sign of peroneal tendon subluxations or tenderness to palpation Negative tarsal tunnel tinel's Able to walk 4 steps. Contralateral ankle unremarkable Patient does have some mild pain over the medial calcaneal region and the plantar aspect of the calcaneal region.  Procedure: Real-time Ultrasound Guided Injection of left plantar fascia for PRP Device: GE Logiq Q7 Ultrasound guided injection is preferred based studies that show increased duration, increased effect, greater accuracy, decreased procedural pain, increased response rate, and decreased cost with ultrasound guided versus blind injection.  Verbal informed consent obtained.  Time-out conducted.  Noted no overlying erythema, induration, or other signs of local infection.  Skin prepped in a sterile fashion.  Local anesthesia: Topical Ethyl chloride.  With sterile technique and under real time ultrasound  guidance: With a 21-gauge 2 inch needle patient was injected with 1 cc of 0.5% Marcaine and then injected with 4 cc of pre-centrifuge PRP through the medial tear that was appreciated on the plantar fascia as well as the rest of the plantar fascia. Completed without difficulty  Pain immediately resolved suggesting accurate placement of the medication.  Advised to call if fevers/chills, erythema, induration, drainage, or persistent bleeding.  Images permanently stored and available for review in the ultrasound unit.  Impression: Technically successful ultrasound guided injection.   Impression and Recommendations:     This case required medical decision making of moderate complexity.      Note: This dictation was prepared with Dragon dictation along with smaller phrase technology. Any transcriptional errors that result from this process are unintentional.

## 2017-07-19 ENCOUNTER — Encounter: Payer: Self-pay | Admitting: Family Medicine

## 2017-07-19 ENCOUNTER — Ambulatory Visit: Payer: Self-pay

## 2017-07-19 ENCOUNTER — Ambulatory Visit: Payer: No Typology Code available for payment source | Admitting: Family Medicine

## 2017-07-19 VITALS — BP 130/98 | HR 75 | Ht 66.5 in

## 2017-07-19 DIAGNOSIS — M79672 Pain in left foot: Secondary | ICD-10-CM

## 2017-07-19 DIAGNOSIS — S92002D Unspecified fracture of left calcaneus, subsequent encounter for fracture with routine healing: Secondary | ICD-10-CM

## 2017-07-19 NOTE — Patient Instructions (Signed)
Good to see you  Heat

## 2017-07-19 NOTE — Assessment & Plan Note (Signed)
Patient had a calcaneal fracture that appears to be actually mostly healed on the ultrasound today.  Patient did have a partial tearing noted of the plantar fascia on MRI.  Patient given the PRP injection today.  Patient will follow the post procedure instructions over the course the next 6 weeks.  Follow-up again in 3 weeks otherwise.

## 2017-08-24 ENCOUNTER — Ambulatory Visit (INDEPENDENT_AMBULATORY_CARE_PROVIDER_SITE_OTHER): Payer: No Typology Code available for payment source | Admitting: Family Medicine

## 2017-08-24 ENCOUNTER — Encounter: Payer: Self-pay | Admitting: Family Medicine

## 2017-08-24 DIAGNOSIS — S92002D Unspecified fracture of left calcaneus, subsequent encounter for fracture with routine healing: Secondary | ICD-10-CM | POA: Diagnosis not present

## 2017-08-24 MED ORDER — GABAPENTIN 100 MG PO CAPS: 200.0000 mg | ORAL_CAPSULE | Freq: Every day | ORAL | 3 refills | Status: DC

## 2017-08-24 NOTE — Progress Notes (Signed)
[name] [address] [place], [place] [address] Phone: [phone] Subjective:     CC: Foot pain follow-up  BJY:[id number]  Cody [name] is a 52 y.o. male coming in with complaint of foot pain follow-up.  Patient was found to have a intrasubstance tear of the plantar fascia.  Given PRP injection.  States that the pain has changed.  More of a tightness than true pain.  Seems to come and go.  Sometimes gets a burning sensation.  Patient still wants to run and feels like he is unable to.     Past Medical History:  Diagnosis Date  . Complication of anesthesia    dizzy, difficult time waking up  . Family history of adverse reaction to anesthesia    unsure, patient was adopted  . Medical history non-contributory    Past Surgical History:  Procedure Laterality Date  . CHOLECYSTECTOMY    . HERNIA REPAIR    . NECK SURGERY     as a child  . RADIOLOGY WITH ANESTHESIA N/A 01/13/2016   Procedure: MRI of brain with and without and cervical spine and lumbar spine;  Surgeon: Medication Radiologist, MD;  Location: [facility];  Service: Radiology;  Laterality: N/A;   Social History   Socioeconomic History  . Marital status: Married    Spouse name: Not on file  . Number of children: Not on file  . Years of education: Not on file  . Highest education level: Not on file  Occupational History  . Not on file  Social Needs  . Financial resource strain: Not on file  . Food insecurity:    Worry: Not on file    Inability: Not on file  . Transportation needs:    Medical: Not on file    Non-medical: Not on file  Tobacco Use  . Smoking status: Never Smoker  . Smokeless tobacco: Never Used  Substance and Sexual Activity  . Alcohol use: Yes    Comment: one beer occasionally  . Drug use: No  . Sexual activity: Yes  Lifestyle  . Physical activity:    Days per week: Not on file    Minutes per session: Not on file  . Stress: Not on file  Relationships  .  Social connections:    Talks on phone: Not on file    Gets together: Not on file    Attends religious service: Not on file    Active member of club or organization: Not on file    Attends meetings of clubs or organizations: Not on file    Relationship status: Not on file  Other Topics Concern  . Not on file  Social History Narrative  . Not on file   Allergies  Allergen Reactions  . Dust Mite Mixed Allergen Ext [Mite (D. Farinae)]   . Penicillins     Has patient had a PCN reaction causing immediate rash, facial/tongue/throat swelling, SOB or lightheadedness with hypotension: No Has patient had a PCN reaction causing severe rash involving mucus membranes or skin necrosis: No Has patient had a PCN reaction that required hospitalization No Has patient had a PCN reaction occurring within the last 10 years: No If all of the above answers are "NO", then may proceed with Cephalosporin use.    Family History  Adopted: Yes     Past medical history, social, surgical and family history all reviewed in electronic medical record.  No pertanent information unless stated regarding to the chief complaint.  Review of Systems:Review of systems updated and as accurate as of 08/24/17  No headache, visual changes, nausea, vomiting, diarrhea, constipation, dizziness, abdominal pain, skin rash, fevers, chills, night sweats, weight loss, swollen lymph nodes, body aches, joint swelling, muscle aches, chest pain, shortness of breath, mood changes.   Objective  There were no vitals taken for this visit. Systems examined below as of 08/24/17   General: No apparent distress alert and oriented x3 mood and affect normal, dressed appropriately.  HEENT: Pupils equal, extraocular movements intact  Respiratory: Patient's speak in full sentences and does not appear short of breath  Cardiovascular: No lower extremity edema, non tender, no erythema  Skin: Warm dry intact with no signs of infection or rash on  extremities or on axial skeleton.  Abdomen: Soft nontender  Neuro: Cranial nerves II through XII are intact, neurovascularly intact in all extremities with 2+ DTRs and 2+ pulses.  Lymph: No lymphadenopathy of posterior or anterior cervical chain or axillae bilaterally.  Gait normal with good balance and coordination.  MSK:  Non tender with full range of motion and good stability and symmetric strength and tone of shoulders, elbows, wrist, hip, knee and ankles bilaterally.   Left foot exam shows mild breakdown of the longitudinal arch.  Severe tenderness still noted over the medial calcaneal region.  Less pain over the plantar aspect bone.  Limited musculoskeletal ultrasound was performed and interpreted by [name]  Ultrasound shows that intrasubstance tearing of the plantar patient has some scar tissue formation which is improving.  Patient denies is having no thickening and measures 1.1 cm in diameter of the plantar fascia.    Impression and Recommendations:     This case required medical decision making of moderate complexity.      Note: This dictation was prepared with Dragon dictation along with smaller phrase technology. Any transcriptional errors that result from this process are unintentional.

## 2017-08-24 NOTE — Assessment & Plan Note (Signed)
Improvement with this in the plantar fashion.  Discussed with patient in great length.  Seems to be doing well.  Patient though is still frustrated and would like him to being completely gone home.  Patient is still frustrated overall.  Patient is considered to go through with the plantar fascial surgery.

## 2017-10-22 ENCOUNTER — Other Ambulatory Visit: Payer: Self-pay | Admitting: *Deleted

## 2017-10-22 MED ORDER — GABAPENTIN 100 MG PO CAPS: 200.0000 mg | ORAL_CAPSULE | Freq: Three times a day (TID) | ORAL | 1 refills | Status: DC

## 2017-10-22 NOTE — Telephone Encounter (Signed)
Gabapentin 200mg  sent into pharmacy.

## 2018-09-28 NOTE — Progress Notes (Signed)
Cody [name], [place] [address] Phone: [phone] Subjective:   Cody [name], am serving as a scribe for Dr. Hulan [name].   CC: Right shoulder neck pain  [id number]  Cody [name] is a 54 y.o. male coming in with complaint of right shoulder pain for 1 year. Pain occurs in the morning over superior aspect. States he has a burning sensation. Five weeks ago patient was having severe cervical spine pain in which he was unable to move head off pillow. Pain with flexion and extension. Pain in the right side of cervical spine. Does have numbness and tingling bilaterally when he wakes up. Denies any radiating symptoms. Pain improves throughout the day. Has been using 2400 mg of IBU daily.      Past Medical History:  Diagnosis Date  . Complication of anesthesia    dizzy, difficult time waking up  . Family history of adverse reaction to anesthesia    unsure, patient was adopted  . Medical history non-contributory    Past Surgical History:  Procedure Laterality Date  . CHOLECYSTECTOMY    . HERNIA REPAIR    . NECK SURGERY     as a child  . RADIOLOGY WITH ANESTHESIA N/A 01/13/2016   Procedure: MRI of brain with and without and cervical spine and lumbar spine;  Surgeon: Medication Radiologist, MD;  Location: [facility];  Service: Radiology;  Laterality: N/A;   Social History   Socioeconomic History  . Marital status: Married    Spouse name: Not on file  . Number of children: Not on file  . Years of education: Not on file  . Highest education level: Not on file  Occupational History  . Not on file  Social Needs  . Financial resource strain: Not on file  . Food insecurity    Worry: Not on file    Inability: Not on file  . Transportation needs    Medical: Not on file    Non-medical: Not on file  Tobacco Use  . Smoking status: Never Smoker  . Smokeless tobacco: Never Used  Substance and Sexual Activity  . Alcohol use: Yes   Comment: one beer occasionally  . Drug use: [name]  . Sexual activity: Yes  Lifestyle  . Physical activity    Days per week: Not on file    Minutes per session: Not on file  . Stress: Not on file  Relationships  . Social Herbalist on phone: Not on file    Gets together: Not on file    Attends religious service: Not on file    Active member of club or organization: Not on file    Attends meetings of clubs or organizations: Not on file    Relationship status: Not on file  Other Topics Concern  . Not on file  Social History Narrative  . Not on file   Allergies  Allergen Reactions  . Dust Mite Mixed Allergen Ext [Mite (D. Farinae)]   . Penicillins     Has patient had a PCN reaction causing immediate rash, facial/tongue/throat swelling, SOB or lightheadedness with hypotension: [name] Has patient had a PCN reaction causing severe rash involving mucus membranes or skin necrosis: [name] Has patient had a PCN reaction that required hospitalization [name] Has patient had a PCN reaction occurring within the last 10 years: [name] If all of the above answers are "[name]", then may proceed with Cephalosporin use.    Family History  Adopted: Yes     Current Outpatient Medications (Cardiovascular):  .  nitroGLYCERIN (NITRODUR - DOSED IN MG/24 HR) 0.2 mg/hr patch, 1/4 patch daily  Current Outpatient Medications (Respiratory):  .  loratadine (CLARITIN) 10 MG tablet, Take 10 mg by mouth daily as needed for allergies.  Current Outpatient Medications (Analgesics):  .  ibuprofen (ADVIL,MOTRIN) 200 MG tablet, Take 400-600 mg by mouth every 6 (six) hours as needed for mild pain (DEPENDS ON PAIN).   Current Outpatient Medications (Other):  .  gabapentin (NEURONTIN) 100 MG capsule, Take 2 capsules (200 mg total) by mouth 3 (three) times daily. .  Vitamin D, Ergocalciferol, (DRISDOL) 50000 units CAPS capsule, Take 1 capsule (50,000 Units total) by mouth every 7 (seven) days.    Past medical history,  social, surgical and family history all reviewed in electronic medical record.  [name] pertanent information unless stated regarding to the chief complaint.   Review of Systems:  [name] headache, visual changes, nausea, vomiting, diarrhea, constipation, dizziness, abdominal pain, skin rash, fevers, chills, night sweats, weight loss, swollen lymph nodes, body aches, joint swelling, muscle aches, chest pain, shortness of breath, mood changes.   Objective  Blood pressure 116/72, pulse 89, height 5' 6.5" (1.689 m), SpO2 96 %.    General: [name] apparent distress alert and oriented x3 mood and affect normal, dressed appropriately.  HEENT: Pupils equal, extraocular movements intact  Respiratory: Patient's speak in full sentences and does not appear short of breath  Cardiovascular: [name] lower extremity edema, non tender, [name] erythema  Skin: Warm dry intact with [name] signs of infection or rash on extremities or on axial skeleton.  Abdomen: Soft nontender  Neuro: Cranial nerves II through XII are intact, neurovascularly intact in all extremities with 2+ DTRs and 2+ pulses.  Lymph: [name] lymphadenopathy of posterior or anterior cervical chain or axillae bilaterally.  Gait normal with good balance and coordination.  MSK:  Non tender with full range of motion and good stability and symmetric strength and tone of shoulders, elbows, wrist, hip, knee and ankles bilaterally.  Neck: Inspection unremarkable. [name] palpable stepoffs. Negative Spurling's maneuver. Full neck range of motion Grip strength and sensation normal in bilateral hands Strength good C4 to T1 distribution [name] sensory change to C4 to T1 Negative Hoffman sign bilaterally Reflexes normal  Shoulder: Right shoulder Inspection reveals [name] abnormalities, atrophy or asymmetry. Palpation is normal with [name] tenderness over AC joint or bicipital groove. ROM mild decrease in external range of motion Rotator cuff strength normal throughout. [name] signs of impingement  with negative Neer and Hawkin's tests, empty can sign. Speeds and Yergason's tests normal. [name] labral pathology noted with negative Obrien's, negative clunk and good stability. Normal scapular function observed. [name] painful arc and [name] drop arm sign. [name] apprehension sign  Neck exam has some fullness, anatomical short neck noted, and does have some tightness but negative Spurling's.  Limited musculoskeletal ultrasound was performed and interpreted by [name]  Limited ultrasound of patient's shoulder shows the patient has very mild subacromial bursitis but truly has synovitis of the acromioclavicular joint with mild to moderate osteoarthritic changes with possible small loose bodies noted. Impression: Capsulitis of the acromioclavicular joint   Impression and Recommendations:     This case required medical decision making of moderate complexity. The above documentation has been reviewed and is accurate and complete [name], DO       Note: This dictation was prepared with Dragon dictation along with smaller phrase technology. Any transcriptional  errors that result from this process are unintentional.

## 2018-09-30 ENCOUNTER — Ambulatory Visit (INDEPENDENT_AMBULATORY_CARE_PROVIDER_SITE_OTHER): Payer: Self-pay | Admitting: Family Medicine

## 2018-09-30 ENCOUNTER — Other Ambulatory Visit: Payer: Self-pay

## 2018-09-30 ENCOUNTER — Ambulatory Visit: Payer: Self-pay

## 2018-09-30 ENCOUNTER — Encounter: Payer: Self-pay | Admitting: Family Medicine

## 2018-09-30 VITALS — BP 116/72 | HR 89 | Ht 66.5 in

## 2018-09-30 DIAGNOSIS — G8929 Other chronic pain: Secondary | ICD-10-CM

## 2018-09-30 DIAGNOSIS — M25511 Pain in right shoulder: Secondary | ICD-10-CM

## 2018-09-30 DIAGNOSIS — M25519 Pain in unspecified shoulder: Secondary | ICD-10-CM | POA: Insufficient documentation

## 2018-09-30 DIAGNOSIS — M542 Cervicalgia: Secondary | ICD-10-CM

## 2018-09-30 MED ORDER — TIZANIDINE HCL 2 MG PO CAPS: 2.0000 mg | ORAL_CAPSULE | Freq: Every day | ORAL | 1 refills | Status: DC

## 2018-09-30 NOTE — Assessment & Plan Note (Signed)
Acromioclavicular pain.  Patient does have swelling on ultrasound.  Discussed with patient about icing regimen and home exercises, discussed topical anti-inflammatories, which activities to do which wants to avoid.  Increase activity as tolerated.  Follow-up again in 4 to 8 weeks

## 2018-09-30 NOTE — Patient Instructions (Addendum)
Good to see you.  Heat before activity Ice 20 minutes 2 times daily after activity. Usually after activity and before bed. Exercises 3 times a week.   Hands in peripheral vision See me again in 6 weeks

## 2018-11-08 ENCOUNTER — Ambulatory Visit (INDEPENDENT_AMBULATORY_CARE_PROVIDER_SITE_OTHER): Payer: No Typology Code available for payment source

## 2018-11-08 ENCOUNTER — Ambulatory Visit (INDEPENDENT_AMBULATORY_CARE_PROVIDER_SITE_OTHER): Payer: No Typology Code available for payment source | Admitting: Family Medicine

## 2018-11-08 ENCOUNTER — Other Ambulatory Visit: Payer: Self-pay

## 2018-11-08 ENCOUNTER — Encounter: Payer: Self-pay | Admitting: Family Medicine

## 2018-11-08 VITALS — BP 124/70 | HR 60 | Temp 97.8°F | Resp 12 | Ht 66.5 in | Wt 213.2 lb

## 2018-11-08 DIAGNOSIS — Z13220 Encounter for screening for lipoid disorders: Secondary | ICD-10-CM | POA: Diagnosis not present

## 2018-11-08 DIAGNOSIS — E559 Vitamin D deficiency, unspecified: Secondary | ICD-10-CM | POA: Diagnosis not present

## 2018-11-08 DIAGNOSIS — R0602 Shortness of breath: Secondary | ICD-10-CM

## 2018-11-08 DIAGNOSIS — M542 Cervicalgia: Secondary | ICD-10-CM

## 2018-11-08 LAB — CBC WITH DIFFERENTIAL/PLATELET
Basophils Absolute: 0 10*3/uL (ref 0.0–0.1)
Basophils Relative: 0.6 % (ref 0.0–3.0)
Eosinophils Absolute: 0.2 10*3/uL (ref 0.0–0.7)
Eosinophils Relative: 4.8 % (ref 0.0–5.0)
HCT: 44.5 % (ref 39.0–52.0)
Hemoglobin: 15.1 g/dL (ref 13.0–17.0)
Lymphocytes Relative: 39.6 % (ref 12.0–46.0)
Lymphs Abs: 1.9 10*3/uL (ref 0.7–4.0)
MCHC: 34 g/dL (ref 30.0–36.0)
MCV: 89.5 fl (ref 78.0–100.0)
Monocytes Absolute: 0.3 10*3/uL (ref 0.1–1.0)
Monocytes Relative: 5.4 % (ref 3.0–12.0)
Neutro Abs: 2.4 10*3/uL (ref 1.4–7.7)
Neutrophils Relative %: 49.6 % (ref 43.0–77.0)
Platelets: 205 10*3/uL (ref 150.0–400.0)
RBC: 4.97 Mil/uL (ref 4.22–5.81)
RDW: 12.7 % (ref 11.5–15.5)
WBC: 4.9 10*3/uL (ref 4.0–10.5)

## 2018-11-08 LAB — COMPREHENSIVE METABOLIC PANEL
ALT: 27 U/L (ref 0–53)
AST: 17 U/L (ref 0–37)
Albumin: 4.8 g/dL (ref 3.5–5.2)
Alkaline Phosphatase: 52 U/L (ref 39–117)
BUN: 20 mg/dL (ref 6–23)
CO2: 27 mEq/L (ref 19–32)
Calcium: 9.7 mg/dL (ref 8.4–10.5)
Chloride: 103 mEq/L (ref 96–112)
Creatinine, Ser: 1.04 mg/dL (ref 0.40–1.50)
GFR: 75.21 mL/min (ref >60.00)
Glucose, Bld: 84 mg/dL (ref 70–99)
Potassium: 4.2 mEq/L (ref 3.5–5.1)
Sodium: 140 mEq/L (ref 135–145)
Total Bilirubin: 1.1 mg/dL (ref 0.2–1.2)
Total Protein: 7.6 g/dL (ref 6.0–8.3)

## 2018-11-08 LAB — LIPID PANEL
Cholesterol: 198 mg/dL (ref 0–200)
HDL: 52.2 mg/dL (ref >39.00)
LDL Cholesterol: 122 mg/dL — ABNORMAL HIGH (ref 0–99)
NonHDL: 145.47
Total CHOL/HDL Ratio: 4
Triglycerides: 118 mg/dL (ref 0.0–149.0)
VLDL: 23.6 mg/dL (ref 0.0–40.0)

## 2018-11-08 LAB — TSH: TSH: 1.93 u[IU]/mL (ref 0.35–4.50)

## 2018-11-08 LAB — VITAMIN D 25 HYDROXY (VIT D DEFICIENCY, FRACTURES): VITD: 32.17 ng/mL (ref 30.00–100.00)

## 2018-11-08 NOTE — Patient Instructions (Addendum)
A few things to remember from today's visit:   Neck discomfort - Plan: DG Cervical Spine Complete  SOB (shortness of breath) - Plan: CBC with Differential/Platelet, Comprehensive metabolic panel, TSH, Magnesium, DG Chest 2 View  Vitamin D deficiency, unspecified - Plan: VITAMIN D 25 Hydroxy (Vit-D Deficiency, Fractures)  Encounter for screening for lipid disorder  Physical examination today and history do not suggest a serious process. Continue monitoring for new symptoms. A healthier diet may help. Continue regular physical activity.  Be sure to arrange your colonoscopy.

## 2018-11-08 NOTE — Progress Notes (Signed)
HPI:  Chief Complaint  Patient presents with  . Neck Pain    ongoing neck pain started 2 months ago  . Pain underarm    right glands swollen    [name] is a 53 y.o. male, who is here today to discuss a few concerns,some described above. Neck discomfort for about 2 months,he woke up with problem, "no pain." Problem is improving but he is concerned about this being caused by a serious process. Denies radiation or UE numbness/tinging,weakness. No limitation of ROM. Negative for recent trauma.  He is afraid of having a serious process like a tumor. He has occipital scalp protruction for long time,not growing and not tender. Negative for headache, visual changes, numbness,tingling,or focal weakness.  Recently he saw Dr. Tamala [name], sport medicine for right shoulder pain.  He was last seen on 09/30/2018.  SOB: For the past 2 weeks and occasionally he feels like he has to breath deeply, "not all the time." He denies associated chest pain,tightness,cough,wheezing,palpitations ,or diaphoresis. He has no problem with exercise. He walks or runs 4-5 miles daily, he has not problem doing so.  His wife thinks it may be related to anxiety.  Negative for heartburn,frequent burping, or changes in diet.   Lab Results  Component Value Date   WBC 6.1 10/20/2015   HGB 15.3 10/20/2015   HCT 44.1 10/20/2015   MCV 87.2 10/20/2015   PLT 208.0 10/20/2015   "A lot " of bowel movement,this is not new, he has have problem his "whole life." Usually after food intake. Negative for abdominal pain,N/V,or blood in stool. He has not identified foods that aggravate problem. No alleviated factors identified.  He feels like "something is not right." He has not had colonoscopy. In the past she has had blood on tissue and dyschezia,attribuyted to hemorrhoids and resolved since he is using prep H wipes.  Hx of vit D deficiency, he is not on vit D supplementation. Completed Ergocalciferol  50,000 U weekly.  He would like to have FLP done today.  Review of Systems  Constitutional: Negative for activity change, appetite change, fatigue, fever and unexpected weight change.  HENT: Negative for nosebleeds, sore throat and trouble swallowing.   Eyes: Negative for pain and redness.  Respiratory: Negative for apnea and stridor.   Cardiovascular: Negative for leg swelling.  Gastrointestinal: Negative for abdominal pain, nausea and vomiting.       No changes in bowel habits.  Endocrine: Negative for cold intolerance, heat intolerance, polydipsia, polyphagia and polyuria.  Genitourinary: Negative for decreased urine volume, dysuria and hematuria.  Musculoskeletal: Negative for arthralgias and gait problem.  Allergic/Immunologic: Negative for environmental allergies.  Neurological: Negative for dizziness, syncope and weakness.  Psychiatric/Behavioral: Negative for confusion. The patient is nervous/anxious.   Rest see pertinent positives and negatives per HPI.   Current Outpatient Medications on File Prior to Visit  Medication Sig Dispense Refill  . nitroGLYCERIN (NITRODUR - DOSED IN MG/24 HR) 0.2 mg/hr patch 1/4 patch daily (Patient not taking: Reported on 11/08/2018) 30 patch 1   No current facility-administered medications on file prior to visit.      Past Medical History:  Diagnosis Date  . Complication of anesthesia    dizzy, difficult time waking up  . Family history of adverse reaction to anesthesia    unsure, patient was adopted  . Medical history non-contributory    Allergies  Allergen Reactions  . Dust Mite Mixed Allergen Ext [Mite (D. Farinae)]   .  Penicillins     Has patient had a PCN reaction causing immediate rash, facial/tongue/throat swelling, SOB or lightheadedness with hypotension: No Has patient had a PCN reaction causing severe rash involving mucus membranes or skin necrosis: No Has patient had a PCN reaction that required hospitalization No Has  patient had a PCN reaction occurring within the last 10 years: No If all of the above answers are "NO", then may proceed with Cephalosporin use.     Social History   Socioeconomic History  . Marital status: Married    Spouse name: Not on file  . Number of children: Not on file  . Years of education: Not on file  . Highest education level: Not on file  Occupational History  . Not on file  Social Needs  . Financial resource strain: Not on file  . Food insecurity    Worry: Not on file    Inability: Not on file  . Transportation needs    Medical: Not on file    Non-medical: Not on file  Tobacco Use  . Smoking status: Never Smoker  . Smokeless tobacco: Never Used  Substance and Sexual Activity  . Alcohol use: Yes    Comment: one beer occasionally  . Drug use: No  . Sexual activity: Yes  Lifestyle  . Physical activity    Days per week: Not on file    Minutes per session: Not on file  . Stress: Not on file  Relationships  . Social Herbalist on phone: Not on file    Gets together: Not on file    Attends religious service: Not on file    Active member of club or organization: Not on file    Attends meetings of clubs or organizations: Not on file    Relationship status: Not on file  Other Topics Concern  . Not on file  Social History Narrative  . Not on file    Vitals:   11/08/18 0954  BP: 124/70  Pulse: 60  Resp: 12  Temp: 97.8 F (36.6 C)  SpO2: 97%   Body mass index is 33.9 kg/m.   Physical Exam  Nursing note reviewed. Constitutional: He is oriented to person, place, and time. He appears well-developed. No distress.  HENT:  Head: Normocephalic and atraumatic.    Mouth/Throat: Oropharynx is clear and moist and mucous membranes are normal.  Occipital scalp on inspection small prominence, soft and not tender. Mobile scalp.  Eyes: Pupils are equal, round, and reactive to light. Conjunctivae are normal.  Cardiovascular: Normal rate and regular  rhythm.  No murmur heard. Pulses:      Dorsalis pedis pulses are 2+ on the right side and 2+ on the left side.  Respiratory: Effort normal and breath sounds normal. No respiratory distress.  GI: Soft. He exhibits no mass. There is no hepatomegaly. There is no abdominal tenderness.  Musculoskeletal:        General: No edema.     Cervical back: He exhibits normal range of motion, no tenderness, no bony tenderness, no edema and no deformity.     Comments: Mild neck discomfort with cervical extension.  Lymphadenopathy:       Head (right side): No occipital adenopathy present.       Head (left side): No occipital adenopathy present.    He has no cervical adenopathy.       Right: No supraclavicular adenopathy present.       Left: No supraclavicular adenopathy present.  Neurological:  He is alert and oriented to person, place, and time. He has normal strength. No cranial nerve deficit. Gait normal.  Reflex Scores:      Patellar reflexes are 2+ on the right side and 2+ on the left side. Skin: Skin is warm. No rash noted. No erythema.  Psychiatric: His mood appears anxious.  Well groomed, good eye contact.    ASSESSMENT AND PLAN:  Mr. Cody [name] was seen today for neck pain and pain underarm.  Diagnoses and all orders for this visit:  Lab Results  Component Value Date   WBC 4.9 11/08/2018   HGB 15.1 11/08/2018   HCT 44.5 11/08/2018   MCV 89.5 11/08/2018   PLT 205.0 11/08/2018   Lab Results  Component Value Date   CREATININE 1.04 11/08/2018   BUN 20 11/08/2018   NA 140 11/08/2018   K 4.2 11/08/2018   CL 103 11/08/2018   CO2 27 11/08/2018   Lab Results  Component Value Date   ALT 27 11/08/2018   AST 17 11/08/2018   ALKPHOS 52 11/08/2018   BILITOT 1.1 11/08/2018   Lab Results  Component Value Date   TSH 1.93 11/08/2018   Lab Results  Component Value Date   CHOL 198 11/08/2018   HDL 52.20 11/08/2018   LDLCALC 122 (H) 11/08/2018   TRIG 118.0 11/08/2018   CHOLHDL 4  11/08/2018    The 10-year ASCVD risk score Mikey Bussing DC Jr., et al., 2013) is: 3.4%   Values used to calculate the score:     Age: 67 years     Sex: Male     Is Non-Hispanic African American: No     Diabetic: No     Tobacco smoker: No     Systolic Blood Pressure: [id number] mmHg     Is BP treated: No     HDL Cholesterol: 52.2 mg/dL     Total Cholesterol: 198 mg/dL  SOB (shortness of breath) Hx and examination doe snot suggest a serious process. Possible etiologies discussed. ? GERD. I do not think cardiac work up is necessary at this time. Instructed about warning signs.  Further recommendations will be given according to labs/imaging results.  -     CBC with Differential/Platelet -     Comprehensive metabolic panel -     TSH -     DG Chest 2 View  Neck discomfort Improving. Most likely musculoskeletal. Instructed about warning signs.    -     DG Cervical Spine Complete  Vitamin D deficiency, unspecified Further recommendations will be given according to 25 OH vit D results.  -     VITAMIN D 25 Hydroxy (Vit-D Deficiency, Fractures)  Encounter for screening for lipid disorder -     Lipid panel  Occipital protruded area, not sure if cranial bone prominence, it has been stable. I do not think head imaging is needed. Recommend continue monitoring for changes and for new symptoms.  Certainly some symptom could be attributed to anxiety. For now he is not interested in pharmacologic treatment.  He voices understanding and agrees with plan.  Return if symptoms worsen or fail to improve.   [name]. Martinique, MD  [facility]. [place] office.

## 2018-11-09 ENCOUNTER — Encounter: Payer: Self-pay | Admitting: Family Medicine

## 2018-11-10 ENCOUNTER — Encounter: Payer: Self-pay | Admitting: Family Medicine

## 2018-11-12 ENCOUNTER — Other Ambulatory Visit: Payer: Self-pay | Admitting: Family Medicine

## 2018-11-12 DIAGNOSIS — M542 Cervicalgia: Secondary | ICD-10-CM

## 2018-11-12 DIAGNOSIS — R200 Anesthesia of skin: Secondary | ICD-10-CM

## 2018-11-12 MED ORDER — PREDNISONE 50 MG PO TABS: 50.0000 mg | ORAL_TABLET | Freq: Every day | ORAL | 0 refills | Status: DC

## 2018-11-12 NOTE — Progress Notes (Signed)
Possible angioedema, talked to better half know action plan

## 2018-11-12 NOTE — Telephone Encounter (Signed)
Please advise if you need me to schedule patient to come in and discuss.

## 2018-12-09 ENCOUNTER — Encounter: Payer: Self-pay | Admitting: Family Medicine

## 2018-12-19 NOTE — Progress Notes (Signed)
[name] [place], [place] [address] Phone: [phone] Subjective:   Cody [name], am serving as a scribe for Dr. Hulan Saas.   CC: Neck pain and headache  [id number]   09/30/2018 Acromioclavicular pain.  Patient does have swelling on ultrasound.  Discussed with patient about icing regimen and home exercises, discussed topical anti-inflammatories, which activities to do which wants to avoid.  Increase activity as tolerated.  Follow-up again in 4 to 8 weeks  Update 12/20/2018 Cody [name] is a 53 y.o. male coming in with complaint of right shoulder and neck pain. Pain seems to be getting gradually worse over the past 4 months. Patient is not that bad in the morning but feels that his pain increases during the day. Patient also mentions a bump in cervical spine at the base of the skull.       Patient did have x-rays previously they were independently visualized by me showing the patient did have some very minimal arthritic changes.  Patient did have a previous MRI of the cervical spine showing a C5-C6 protrusion on the right side.  Past Medical History:  Diagnosis Date  . Complication of anesthesia    dizzy, difficult time waking up  . Family history of adverse reaction to anesthesia    unsure, patient was adopted  . Medical history non-contributory    Past Surgical History:  Procedure Laterality Date  . CHOLECYSTECTOMY    . HERNIA REPAIR    . NECK SURGERY     as a child  . RADIOLOGY WITH ANESTHESIA N/A 01/13/2016   Procedure: MRI of brain with and without and cervical spine and lumbar spine;  Surgeon: Medication Radiologist, MD;  Location: [facility];  Service: Radiology;  Laterality: N/A;   Social History   Socioeconomic History  . Marital status: Married    Spouse name: Not on file  . Number of children: Not on file  . Years of education: Not on file  . Highest education level: Not on file  Occupational History  . Not  on file  Social Needs  . Financial resource strain: Not on file  . Food insecurity    Worry: Not on file    Inability: Not on file  . Transportation needs    Medical: Not on file    Non-medical: Not on file  Tobacco Use  . Smoking status: Never Smoker  . Smokeless tobacco: Never Used  Substance and Sexual Activity  . Alcohol use: Yes    Comment: one beer occasionally  . Drug use: [name]  . Sexual activity: Yes  Lifestyle  . Physical activity    Days per week: Not on file    Minutes per session: Not on file  . Stress: Not on file  Relationships  . Social Herbalist on phone: Not on file    Gets together: Not on file    Attends religious service: Not on file    Active member of club or organization: Not on file    Attends meetings of clubs or organizations: Not on file    Relationship status: Not on file  Other Topics Concern  . Not on file  Social History Narrative  . Not on file   Allergies  Allergen Reactions  . Dust Mite Mixed Allergen Ext [Mite (D. Farinae)]   . Penicillins     Has patient had a PCN reaction causing immediate rash, facial/tongue/throat swelling, SOB or lightheadedness with  hypotension: [name] Has patient had a PCN reaction causing severe rash involving mucus membranes or skin necrosis: [name] Has patient had a PCN reaction that required hospitalization [name] Has patient had a PCN reaction occurring within the last 10 years: [name] If all of the above answers are "[name]", then may proceed with Cephalosporin use.    Family History  Adopted: Yes    Current Outpatient Medications (Endocrine & Metabolic):  .  predniSONE (DELTASONE) 50 MG tablet, Take 1 tablet (50 mg total) by mouth daily.  Current Outpatient Medications (Cardiovascular):  .  nitroGLYCERIN (NITRODUR - DOSED IN MG/24 HR) 0.2 mg/hr patch, 1/4 patch daily (Patient not taking: Reported on 11/08/2018)     Past medical history, social, surgical and family history all reviewed in electronic  medical record.  [name] pertanent information unless stated regarding to the chief complaint.   Review of Systems:  [name] headache, visual changes, nausea, vomiting, diarrhea, constipation, dizziness, abdominal pain, skin rash, fevers, chills, night sweats, weight loss, swollen lymph nodes, body aches, joint swelling, , chest pain, shortness of breath, mood changes.  Positive muscle aches  Objective  Blood pressure 120/86, pulse 66, height 5\' 6"  (1.676 m), SpO2 97 %.    General: [name] apparent distress alert and oriented x3 mood and affect normal, dressed appropriately.  Patient is highly anxious HEENT: Pupils equal, extraocular movements intact  Respiratory: Patient's speak in full sentences and does not appear short of breath  Cardiovascular: [name] lower extremity edema, non tender, [name] erythema  Skin: Warm dry intact with [name] signs of infection or rash on extremities or on axial skeleton.  Abdomen: Soft nontender  Neuro: Cranial nerves II through XII are intact, neurovascularly intact in all extremities with 2+ DTRs and 2+ pulses.  Lymph: [name] lymphadenopathy of posterior or anterior cervical chain or axillae bilaterally.  Gait normal with good balance and coordination.  MSK:  tender with full range of motion and good stability and symmetric strength and tone of shoulders, elbows, wrist, hip, knee and ankles bilaterally.  Neck: Inspection loss of lordosis. [name] palpable stepoffs. Negative Spurling's maneuver. Limited range of motion exceptionally lacking last 5 degrees of extension and sidebending to the left causing severe pain on the right side of the neck but [name] significant radicular symptoms Grip strength and sensation normal in bilateral hands Strength good C4 to T1 distribution [name] sensory change to C4 to T1 Negative Hoffman sign bilaterally Reflexes normal Tightness is noted in the parascapular region on the right side.     Impression and Recommendations:     This case required medical  decision making of moderate complexity. The above documentation has been reviewed and is accurate and complete [name], DO       Note: This dictation was prepared with Dragon dictation along with smaller phrase technology. Any transcriptional errors that result from this process are unintentional.

## 2018-12-20 ENCOUNTER — Ambulatory Visit (INDEPENDENT_AMBULATORY_CARE_PROVIDER_SITE_OTHER): Payer: No Typology Code available for payment source | Admitting: Family Medicine

## 2018-12-20 ENCOUNTER — Other Ambulatory Visit: Payer: Self-pay

## 2018-12-20 ENCOUNTER — Encounter: Payer: Self-pay | Admitting: Family Medicine

## 2018-12-20 DIAGNOSIS — M542 Cervicalgia: Secondary | ICD-10-CM

## 2018-12-20 NOTE — Assessment & Plan Note (Signed)
Patient has neck pain.  Concerned that it is worsening.  Patient is on the right side.  States the severity of the pain is 9 out of 10.  Seems to hurt with any type of extension and concern for either a facet arthropathy, potential cyst formation or nerve root impingement that is contributing.  Patient is not having any significant radicular symptoms but unfortunately the pain is unrelenting and waking him up at night and affecting daily activities.  Patient is failed all conservative therapy at this moment from other providers and I do feel that advanced imaging would be warranted.  Patient unfortunately does have underlying anxiety disorder that makes an MRI difficult.  We discussed the possibility of a CT scan but this would potentially miss soft tissue so patient has neglected to do the MRI but would like to do it under anesthesia.  Warned of potential side effects of anesthesia and having this happen to do in the hospital during the coronavirus outbreak.  Patient still feels that this is necessary.

## 2018-12-24 NOTE — Addendum Note (Signed)
Addended by: Douglass Rivers T on: 12/24/2018 12:24 PM   Modules accepted: Orders

## 2018-12-25 NOTE — Addendum Note (Signed)
Addended by: Douglass Rivers T on: 12/25/2018 03:38 PM   Modules accepted: Orders

## 2019-01-14 ENCOUNTER — Encounter: Payer: Self-pay | Admitting: Internal Medicine

## 2019-01-14 DIAGNOSIS — K649 Unspecified hemorrhoids: Secondary | ICD-10-CM

## 2019-01-14 HISTORY — DX: Unspecified hemorrhoids: K64.9

## 2019-01-17 ENCOUNTER — Encounter (HOSPITAL_COMMUNITY): Payer: Self-pay | Admitting: *Deleted

## 2019-01-17 ENCOUNTER — Other Ambulatory Visit: Payer: Self-pay

## 2019-01-17 NOTE — Progress Notes (Signed)
Cody [name] denies chest pain or shortness of breath.  Cody [name] denies that he nor his wife have any S/S of Covid.  Cody [name] returned from [place]- 03/06/2018. Cody [name] will have Covid test 01/18/2019.

## 2019-01-18 ENCOUNTER — Other Ambulatory Visit (HOSPITAL_COMMUNITY)
Admission: RE | Admit: 2019-01-18 | Discharge: 2019-01-18 | Disposition: A | Discharge status: Home / Self Care | Payer: No Typology Code available for payment source | Source: Ambulatory Visit | Attending: Family Medicine | Admitting: Family Medicine

## 2019-01-18 DIAGNOSIS — Z01812 Encounter for preprocedural laboratory examination: Secondary | ICD-10-CM | POA: Diagnosis present

## 2019-01-18 DIAGNOSIS — Z20828 Contact with and (suspected) exposure to other viral communicable diseases: Secondary | ICD-10-CM | POA: Insufficient documentation

## 2019-01-18 LAB — SARS CORONAVIRUS 2 (TAT 6-24 HRS): SARS Coronavirus 2: NEGATIVE

## 2019-01-20 NOTE — Anesthesia Preprocedure Evaluation (Addendum)
Anesthesia Evaluation  Patient identified by MRN, date of birth, ID band Patient awake    Reviewed: Allergy & Precautions, NPO status , Patient's Chart, lab work & pertinent test results  History of Anesthesia Complications Negative for: history of anesthetic complications  Airway Mallampati: II  TM Distance: >3 FB Neck ROM: Full    Dental  (+) Missing,    Pulmonary neg pulmonary ROS,    Pulmonary exam normal        Cardiovascular negative cardio ROS Normal cardiovascular exam     Neuro/Psych Anxiety Neck pain    GI/Hepatic negative GI ROS, Neg liver ROS,   Endo/Other  negative endocrine ROS  Renal/GU negative Renal ROS  negative genitourinary   Musculoskeletal negative musculoskeletal ROS (+)   Abdominal   Peds  Hematology negative hematology ROS (+)   Anesthesia Other Findings Day of surgery medications reviewed with patient.  Reproductive/Obstetrics negative OB ROS                            Anesthesia Physical Anesthesia Plan  ASA: I  Anesthesia Plan: General   Post-op Pain Management:    Induction: Intravenous  PONV Risk Score and Plan: 2 and Treatment may vary due to age or medical condition, Ondansetron, Dexamethasone and Midazolam  Airway Management Planned: Oral ETT  Additional Equipment: None  Intra-op Plan:   Post-operative Plan: Extubation in OR  Informed Consent: I have reviewed the patients History and Physical, chart, labs and discussed the procedure including the risks, benefits and alternatives for the proposed anesthesia with the patient or authorized representative who has indicated his/her understanding and acceptance.     Dental advisory given  Plan Discussed with: CRNA  Anesthesia Plan Comments:        Anesthesia Quick Evaluation

## 2019-01-21 ENCOUNTER — Ambulatory Visit (HOSPITAL_COMMUNITY)
Admission: RE | Admit: 2019-01-21 | Discharge: 2019-01-21 | Disposition: A | Discharge status: Home / Self Care | Payer: No Typology Code available for payment source | Source: Ambulatory Visit | Attending: Family Medicine | Admitting: Family Medicine

## 2019-01-21 ENCOUNTER — Encounter (HOSPITAL_COMMUNITY): Payer: Self-pay

## 2019-01-21 ENCOUNTER — Ambulatory Visit (HOSPITAL_COMMUNITY): Payer: No Typology Code available for payment source | Admitting: Certified Registered"

## 2019-01-21 ENCOUNTER — Other Ambulatory Visit: Payer: Self-pay

## 2019-01-21 ENCOUNTER — Encounter (HOSPITAL_COMMUNITY)
Admission: RE | Disposition: A | Discharge status: Home / Self Care | Payer: Self-pay | Source: Ambulatory Visit | Attending: Family Medicine

## 2019-01-21 DIAGNOSIS — M542 Cervicalgia: Secondary | ICD-10-CM | POA: Insufficient documentation

## 2019-01-21 DIAGNOSIS — G8929 Other chronic pain: Secondary | ICD-10-CM

## 2019-01-21 HISTORY — DX: Claustrophobia: F40.240

## 2019-01-21 HISTORY — PX: RADIOLOGY WITH ANESTHESIA: SHX6223

## 2019-01-21 SURGERY — MRI WITH ANESTHESIA
Anesthesia: General

## 2019-01-21 MED ORDER — DEXAMETHASONE SODIUM PHOSPHATE 10 MG/ML IJ SOLN
INTRAMUSCULAR | Status: DC | PRN
  Administered 2019-01-21: 10 mg via INTRAVENOUS

## 2019-01-21 MED ORDER — MIDAZOLAM HCL 5 MG/5ML IJ SOLN
INTRAMUSCULAR | Status: DC | PRN
  Administered 2019-01-21: 2 mg via INTRAVENOUS

## 2019-01-21 MED ORDER — ROCURONIUM BROMIDE 50 MG/5ML IV SOSY
PREFILLED_SYRINGE | INTRAVENOUS | Status: DC | PRN
  Administered 2019-01-21: 60 mg via INTRAVENOUS

## 2019-01-21 MED ORDER — FENTANYL CITRATE (PF) 100 MCG/2ML IJ SOLN
INTRAMUSCULAR | Status: DC | PRN
  Administered 2019-01-21: 100 ug via INTRAVENOUS

## 2019-01-21 MED ORDER — LIDOCAINE 2% (20 MG/ML) 5 ML SYRINGE
INTRAMUSCULAR | Status: DC | PRN
  Administered 2019-01-21: 60 mg via INTRAVENOUS

## 2019-01-21 MED ORDER — LACTATED RINGERS IV SOLN
INTRAVENOUS | Status: DC
  Administered 2019-01-21: 07:00:00 via INTRAVENOUS

## 2019-01-21 MED ORDER — PROPOFOL 10 MG/ML IV BOLUS
INTRAVENOUS | Status: DC | PRN
  Administered 2019-01-21: 30 mg via INTRAVENOUS
  Administered 2019-01-21: 150 mg via INTRAVENOUS

## 2019-01-21 MED ORDER — ONDANSETRON HCL 4 MG/2ML IJ SOLN
INTRAMUSCULAR | Status: DC | PRN
  Administered 2019-01-21: 4 mg via INTRAVENOUS

## 2019-01-21 MED ORDER — SUGAMMADEX SODIUM 200 MG/2ML IV SOLN
INTRAVENOUS | Status: DC | PRN
  Administered 2019-01-21: 200 mg via INTRAVENOUS

## 2019-01-21 NOTE — Transfer of Care (Signed)
Immediate Anesthesia Transfer of Care Note  Patient: Cody [name]  Procedure(s) Performed: MRI WITH ANESTHESIA CERVICAL SPINE WITHOUT IV CONTRAST (N/A )  Patient Location: PACU  Anesthesia Type:General  Level of Consciousness: drowsy and patient cooperative  Airway & Oxygen Therapy: Patient Spontanous Breathing  Post-op Assessment: Report given to RN and Post -op Vital signs reviewed and stable  Post vital signs: Reviewed and stable  Last Vitals:  Vitals Value Taken Time  BP 119/85 01/21/19 0927  Temp 36.5 C 01/21/19 0927  Pulse 100 01/21/19 0930  Resp 13 01/21/19 0930  SpO2 100 % 01/21/19 0930  Vitals shown include unvalidated device data.  Last Pain:  Vitals:   01/21/19 0658  TempSrc: Oral  PainSc:          Complications: No apparent anesthesia complications

## 2019-01-21 NOTE — Anesthesia Procedure Notes (Signed)
Procedure Name: Intubation Date/Time: 01/21/2019 8:43 AM Performed by: Lance Coon, CRNA Pre-anesthesia Checklist: Patient identified, Emergency Drugs available, Suction available, Patient being monitored and Timeout performed Patient Re-evaluated:Patient Re-evaluated prior to induction Oxygen Delivery Method: Circle system utilized Preoxygenation: Pre-oxygenation with 100% oxygen Induction Type: IV induction Ventilation: Mask ventilation without difficulty Laryngoscope Size: Miller and 3 Grade View: Grade I Tube type: Oral Tube size: 7.5 mm Number of attempts: 1 Airway Equipment and Method: Stylet Placement Confirmation: ETT inserted through vocal cords under direct vision,  positive ETCO2 and breath sounds checked- equal and bilateral Secured at: 21 cm Tube secured with: Tape Dental Injury: Teeth and Oropharynx as per pre-operative assessment

## 2019-01-21 NOTE — Progress Notes (Signed)
MRI form faxed to radiology 

## 2019-01-21 NOTE — Anesthesia Postprocedure Evaluation (Signed)
Anesthesia Post Note  Patient: Cody [name]  Procedure(s) Performed: MRI WITH ANESTHESIA CERVICAL SPINE WITHOUT IV CONTRAST (N/A )     Patient location during evaluation: PACU Anesthesia Type: General Level of consciousness: awake and alert and oriented Pain management: pain level controlled Vital Signs Assessment: post-procedure vital signs reviewed and stable Respiratory status: spontaneous breathing, nonlabored ventilation and respiratory function stable Cardiovascular status: blood pressure returned to baseline Postop Assessment: no apparent nausea or vomiting Anesthetic complications: no    Last Vitals:  Vitals:   01/21/19 0658 01/21/19 0927  BP: 116/78 119/85  Pulse: (!) 58   Resp: 20 20  Temp: 36.7 C 36.5 C  SpO2: 96%     Last Pain:  Vitals:   01/21/19 0927  TempSrc:   PainSc: 0-No pain                 Brennan Bailey

## 2019-01-22 ENCOUNTER — Encounter (HOSPITAL_COMMUNITY): Payer: Self-pay | Admitting: Radiology

## 2019-01-27 ENCOUNTER — Ambulatory Visit
Admission: RE | Admit: 2019-01-27 | Discharge: 2019-01-27 | Disposition: A | Discharge status: Home / Self Care | Payer: No Typology Code available for payment source | Source: Ambulatory Visit | Attending: Family Medicine | Admitting: Family Medicine

## 2019-01-27 ENCOUNTER — Other Ambulatory Visit: Payer: Self-pay

## 2019-01-27 DIAGNOSIS — G8929 Other chronic pain: Secondary | ICD-10-CM

## 2019-01-27 DIAGNOSIS — M542 Cervicalgia: Secondary | ICD-10-CM

## 2019-01-27 MED ORDER — IOPAMIDOL (ISOVUE-M 300) INJECTION 61%
1.0000 mL | Freq: Once | INTRAMUSCULAR | Status: AC | PRN
  Administered 2019-01-27: 1 mL via EPIDURAL

## 2019-01-27 MED ORDER — TRIAMCINOLONE ACETONIDE 40 MG/ML IJ SUSP (RADIOLOGY)
60.0000 mg | Freq: Once | INTRAMUSCULAR | Status: AC
  Administered 2019-01-27: 60 mg via EPIDURAL

## 2019-01-27 NOTE — Discharge Instructions (Signed)

## 2019-02-27 ENCOUNTER — Other Ambulatory Visit: Payer: Self-pay

## 2019-02-27 ENCOUNTER — Telehealth: Payer: Self-pay | Admitting: *Deleted

## 2019-02-27 ENCOUNTER — Ambulatory Visit (AMBULATORY_SURGERY_CENTER): Payer: 59 | Admitting: *Deleted

## 2019-02-27 VITALS — Ht 66.0 in | Wt 205.0 lb

## 2019-02-27 DIAGNOSIS — Z1211 Encounter for screening for malignant neoplasm of colon: Secondary | ICD-10-CM

## 2019-02-27 DIAGNOSIS — Z86010 Personal history of colonic polyps: Secondary | ICD-10-CM

## 2019-02-27 DIAGNOSIS — Z01818 Encounter for other preprocedural examination: Secondary | ICD-10-CM

## 2019-02-27 MED ORDER — NA SULFATE-K SULFATE-MG SULF 17.5-3.13-1.6 GM/177ML PO SOLN: 1.0000 | Freq: Once | ORAL | 0 refills | Status: AC

## 2019-02-27 NOTE — Progress Notes (Signed)
Called patient. Advised patient of provider's approval for requested procedure, as well as any comments/instructions from provider.   Provided patient w/ verbal instructions concerning pre-, intra- and post-procedure preparation and instructions.  Patient verbalized understanding of the above.   Patient has also been mailed a letter containing the following instructions AMB GI [id number]

## 2019-02-27 NOTE — Progress Notes (Signed)

## 2019-02-27 NOTE — Telephone Encounter (Signed)
Virtual pre-visit completed.  Instruction packet mailed to patient .

## 2019-03-04 ENCOUNTER — Encounter: Payer: Self-pay | Admitting: Internal Medicine

## 2019-03-10 ENCOUNTER — Other Ambulatory Visit: Payer: Self-pay | Admitting: Internal Medicine

## 2019-03-10 ENCOUNTER — Ambulatory Visit (INDEPENDENT_AMBULATORY_CARE_PROVIDER_SITE_OTHER): Payer: 59

## 2019-03-10 DIAGNOSIS — Z1159 Encounter for screening for other viral diseases: Secondary | ICD-10-CM

## 2019-03-10 LAB — SARS CORONAVIRUS 2 (TAT 6-24 HRS): SARS Coronavirus 2: NEGATIVE

## 2019-03-13 ENCOUNTER — Encounter: Payer: Self-pay | Admitting: Internal Medicine

## 2019-03-13 ENCOUNTER — Other Ambulatory Visit: Payer: Self-pay

## 2019-03-13 ENCOUNTER — Ambulatory Visit (AMBULATORY_SURGERY_CENTER): Payer: 59 | Admitting: Internal Medicine

## 2019-03-13 VITALS — BP 120/58 | HR 58 | Temp 95.3°F | Resp 16 | Ht 66.5 in | Wt 205.0 lb

## 2019-03-13 DIAGNOSIS — D124 Benign neoplasm of descending colon: Secondary | ICD-10-CM | POA: Diagnosis not present

## 2019-03-13 DIAGNOSIS — Z1211 Encounter for screening for malignant neoplasm of colon: Secondary | ICD-10-CM | POA: Diagnosis not present

## 2019-03-13 DIAGNOSIS — D123 Benign neoplasm of transverse colon: Secondary | ICD-10-CM

## 2019-03-13 DIAGNOSIS — D125 Benign neoplasm of sigmoid colon: Secondary | ICD-10-CM

## 2019-03-13 MED ORDER — SODIUM CHLORIDE 0.9 % IV SOLN: 500.0000 mL | Freq: Once | INTRAVENOUS | Status: DC

## 2019-03-13 NOTE — Progress Notes (Signed)
Called to room to assist during endoscopic procedure.  Patient ID and intended procedure confirmed with present staff. Received instructions for my participation in the procedure from the performing physician.  

## 2019-03-13 NOTE — Progress Notes (Signed)
PT taken to PACU. Monitors in place. VSS. Report given to RN. 

## 2019-03-13 NOTE — Progress Notes (Signed)
Temp-JB VS- KA  Pt's states no medical or surgical changes since previsit or office visit.

## 2019-03-13 NOTE — Op Note (Signed)
[facility] Patient Name: Cody [name] Procedure Date: 03/13/2019 8:36 AM MRN: [id number] Endoscopist: Jerene Bears , MD Age: 54 Referring MD:  Date of Birth: [birth year] Gender: Male Account #: [id number] Procedure:                Colonoscopy Indications:              Screening for colorectal malignant neoplasm, This                            is the patient's first colonoscopy Medicines:                Monitored Anesthesia Care Procedure:                Pre-Anesthesia Assessment:                           - Prior to the procedure, a History and Physical                            was performed, and patient medications and                            allergies were reviewed. The patient's tolerance of                            previous anesthesia was also reviewed. The risks                            and benefits of the procedure and the sedation                            options and risks were discussed with the patient.                            All questions were answered, and informed consent                            was obtained. Prior Anticoagulants: The patient has                            taken no previous anticoagulant or antiplatelet                            agents. ASA Grade Assessment: II - A patient with                            mild systemic disease. After reviewing the risks                            and benefits, the patient was deemed in                            satisfactory condition to undergo the procedure.  After obtaining informed consent, the colonoscope                            was passed under direct vision. Throughout the                            procedure, the patient's blood pressure, pulse, and                            oxygen saturations were monitored continuously. The                            Colonoscope was introduced through the anus and                            advanced to the terminal ileum.  The colonoscopy was                            performed without difficulty. The patient tolerated                            the procedure well. The quality of the bowel                            preparation was good. The terminal ileum, ileocecal                            valve, appendiceal orifice, and rectum were                            photographed. Scope In: 8:41:37 AM Scope Out: 8:55:55 AM Scope Withdrawal Time: 0 hours 13 minutes 10 seconds  Total Procedure Duration: 0 hours 14 minutes 18 seconds  Findings:                 The digital rectal exam was normal.                           The terminal ileum appeared normal.                           A 6 mm polyp was found in the transverse colon. The                            polyp was sessile. The polyp was removed with a                            cold snare. Resection and retrieval were complete.                           A 5 mm polyp was found in the descending colon. The                            polyp was sessile. The polyp was removed  with a                            cold snare. Resection and retrieval were complete.                           A 4 mm polyp was found in the sigmoid colon. The                            polyp was sessile. The polyp was removed with a                            cold snare. Resection and retrieval were complete.                           Internal hemorrhoids were found during                            retroflexion. The hemorrhoids were small. Complications:            No immediate complications. Estimated Blood Loss:     Estimated blood loss was minimal. Impression:               - The examined portion of the ileum was normal.                           - One 6 mm polyp in the transverse colon, removed                            with a cold snare. Resected and retrieved.                           - One 5 mm polyp in the descending colon, removed                            with a cold snare.  Resected and retrieved.                           - One 4 mm polyp in the sigmoid colon, removed with                            a cold snare. Resected and retrieved.                           - Small internal hemorrhoids. Recommendation:           - Patient has a contact number available for                            emergencies. The signs and symptoms of potential                            delayed complications were discussed with the  patient. Return to normal activities tomorrow.                            Written discharge instructions were provided to the                            patient.                           - Resume previous diet.                           - Continue present medications.                           - Await pathology results.                           - Repeat colonoscopy is recommended for                            surveillance. The colonoscopy date will be                            determined after pathology results from today's                            exam become available for review. Jerene Bears, MD 03/13/2019 8:59:38 AM This report has been signed electronically.

## 2019-03-13 NOTE — Patient Instructions (Signed)
Handouts on polyps and hemorrhoids given to you today  Await pathology results   YOU HAD AN ENDOSCOPIC PROCEDURE TODAY AT [facility]:   Refer to the procedure report that was given to you for any specific questions about what was found during the examination.  If the procedure report does not answer your questions, please call your gastroenterologist to clarify.  If you requested that your care partner not be given the details of your procedure findings, then the procedure report has been included in a sealed envelope for you to review at your convenience later.  YOU SHOULD EXPECT: Some feelings of bloating in the abdomen. Passage of more gas than usual.  Walking can help get rid of the air that was put into your GI tract during the procedure and reduce the bloating. If you had a lower endoscopy (such as a colonoscopy or flexible sigmoidoscopy) you may notice spotting of blood in your stool or on the toilet paper. If you underwent a bowel prep for your procedure, you may not have a normal bowel movement for a few days.  Please Note:  You might notice some irritation and congestion in your nose or some drainage.  This is from the oxygen used during your procedure.  There is no need for concern and it should clear up in a day or so.  SYMPTOMS TO REPORT IMMEDIATELY:   Following lower endoscopy (colonoscopy or flexible sigmoidoscopy):  Excessive amounts of blood in the stool  Significant tenderness or worsening of abdominal pains  Swelling of the abdomen that is new, acute  Fever of 100F or higher  For urgent or emergent issues, a gastroenterologist can be reached at any hour by calling [phone].   DIET:  We do recommend a small meal at first, but then you may proceed to your regular diet.  Drink plenty of fluids but you should avoid alcoholic beverages for 24 hours.  ACTIVITY:  You should plan to take it easy for the rest of today and you should NOT DRIVE or use heavy  machinery until tomorrow (because of the sedation medicines used during the test).    FOLLOW UP: Our staff will call the number listed on your records 48-72 hours following your procedure to check on you and address any questions or concerns that you may have regarding the information given to you following your procedure. If we do not reach you, we will leave a message.  We will attempt to reach you two times.  During this call, we will ask if you have developed any symptoms of COVID 19. If you develop any symptoms (ie: fever, flu-like symptoms, shortness of breath, cough etc.) before then, please call [phone].  If you test positive for Covid 19 in the 2 weeks post procedure, please call and report this information to [place].    If any biopsies were taken you will be contacted by phone or by letter within the next 1-3 weeks.  Please call us at [phone] if you have not heard about the biopsies in 3 weeks.    SIGNATURES/CONFIDENTIALITY: You and/or your care partner have signed paperwork which will be entered into your electronic medical record.  These signatures attest to the fact that that the information above on your After Visit Summary has been reviewed and is understood.  Full responsibility of the confidentiality of this discharge information lies with you and/or your care-partner.

## 2019-03-17 ENCOUNTER — Telehealth: Payer: Self-pay | Admitting: *Deleted

## 2019-03-17 ENCOUNTER — Telehealth: Payer: Self-pay

## 2019-03-17 NOTE — Telephone Encounter (Signed)
  Follow up Call-  Call back number 03/13/2019  Post procedure Call Back phone  # [phone]  Permission to leave phone message Yes  Some recent data might be hidden     Patient questions:  Do you have a fever, pain , or abdominal swelling? No. Pain Score  0 *  Have you tolerated food without any problems? Yes.    Have you been able to return to your normal activities? Yes.    Do you have any questions about your discharge instructions: Diet   No. Medications  No. Follow up visit  No.  Do you have questions or concerns about your Care? No.  Actions: * If pain score is 4 or above: No action needed, pain <4.  1. Have you developed a fever since your procedure? No   2.   Have you had an respiratory symptoms (SOB or cough) since your procedure? no  3.   Have you tested positive for COVID 19 since your procedure no  4.   Have you had any family members/close contacts diagnosed with the COVID 19 since your procedure?  no   If yes to any of these questions please route to Joylene John, RN and Alphonsa Gin, Therapist, sports.

## 2019-03-17 NOTE — Telephone Encounter (Signed)
  Follow up Call-  Left message on f/u call

## 2019-03-18 ENCOUNTER — Encounter: Payer: Self-pay | Admitting: Internal Medicine

## 2019-04-10 ENCOUNTER — Telehealth: Payer: Self-pay | Admitting: Family Medicine

## 2019-04-10 NOTE — Telephone Encounter (Signed)
Chantel from [employer] is calling to check to see if we received the fax requesting medical records that was sent on 04/04/19. Informed her that I am not sure if it was received but if she faxes it again I will make sure it gets to the correct person.  She understood. Nothing further   Received Fax and gave it to [place]

## 2019-05-28 ENCOUNTER — Telehealth: Payer: Self-pay | Admitting: Primary Care

## 2019-05-28 NOTE — Telephone Encounter (Signed)
The patient has not responded to outreach. Removing Dr. Mason as PCP per his request.

## 2019-07-21 ENCOUNTER — Other Ambulatory Visit: Payer: Self-pay

## 2019-07-21 ENCOUNTER — Encounter: Payer: Self-pay | Admitting: Family Medicine

## 2019-07-21 ENCOUNTER — Ambulatory Visit: Payer: 59 | Admitting: Family Medicine

## 2019-07-21 DIAGNOSIS — M542 Cervicalgia: Secondary | ICD-10-CM

## 2019-07-21 NOTE — Assessment & Plan Note (Addendum)
Patient continues to have neck pain and seems to be worsening.  This is causing psychological strain on him as well.  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  Quick mild ultrasound shows a calcified lymph node but no abnormal vascularity.  Patient does not have any true masses warranted in this area.  Still seems to be more of a muscle tightness than anything else.  Discussed with patient though because of this being greater than 1 year no significant for resolution of pain with anything we have done so far I do feel that possible CT head and neck likely to further evaluate any deep masses not noted on ultrasound   Patient is in agreement will follow up after imaging.  Patient given short course of prednisone today as well for this chronic problem with exacerbation.

## 2019-07-21 NOTE — Progress Notes (Signed)
[place] [address] [place] Phone: [phone] Subjective:   I Kandace Blitz am serving as a Education administrator for Dr. Hulan Saas.  This visit occurred during the SARS-CoV-2 public health emergency.  Safety protocols were in place, including screening questions prior to the visit, additional usage of staff PPE, and extensive cleaning of exam room while observing appropriate contact time as indicated for disinfecting solutions.   I'm seeing this patient by the request  of:  Martinique, [name], MD  CC: neck pain   [id number]   12/20/2018 Patient has neck pain.  Concerned that it is worsening.  Patient is on the right side.  States the severity of the pain is 9 out of 10.  Seems to hurt with any type of extension and concern for either a facet arthropathy, potential cyst formation or nerve root impingement that is contributing.  Patient is not having any significant radicular symptoms but unfortunately the pain is unrelenting and waking him up at night and affecting daily activities.  Patient is failed all conservative therapy at this moment from other providers and I do feel that advanced imaging would be warranted.  Patient unfortunately does have underlying anxiety disorder that makes an MRI difficult.  We discussed the possibility of a CT scan but this would potentially miss soft tissue so patient has neglected to do the MRI but would like to do it under anesthesia.  Warned of potential side effects of anesthesia and having this happen to do in the hospital during the coronavirus outbreak.  Patient still feels that this is necessary.   NAZIER NEYHART is a 54 y.o. male coming in with complaint of neck pain. Patient states his neck is terrible. States his pain is cervical. Bump on the right side of the neck. Flexion is most painful. Constant pain for about a year. Patient has lost about 25-30 lbs. States meloxicam did not help.  Had mri independently  visualized by [name] small disc protrusion osteophytes C5/6.      Past Medical History:  Diagnosis Date  . Claustrophobia   . Complication of anesthesia    dizzy, difficult time waking up  . Hemorrhoid 01/2019   external  . Medical history non-contributory    Past Surgical History:  Procedure Laterality Date  . HERNIA REPAIR     umbicial  . LAPAROSCOPIC CHOLECYSTECTOMY    . NECK SURGERY     as a child- patient is not sure what itwas- had cat scratch fever  . RADIOLOGY WITH ANESTHESIA N/A 01/13/2016   Procedure: MRI of brain with and without and cervical spine and lumbar spine;  Surgeon: Medication Radiologist, MD;  Location: [facility];  Service: Radiology;  Laterality: N/A;  . RADIOLOGY WITH ANESTHESIA N/A 01/21/2019   Procedure: MRI WITH ANESTHESIA CERVICAL SPINE WITHOUT IV CONTRAST;  Surgeon: Radiologist, Medication, MD;  Location: [facility];  Service: Radiology;  Laterality: N/A;   Social History   Socioeconomic History  . Marital status: Married    Spouse name: Not on file  . Number of children: Not on file  . Years of education: Not on file  . Highest education level: Not on file  Occupational History  . Not on file  Tobacco Use  . Smoking status: Never Smoker  . Smokeless tobacco: Never Used  Substance and Sexual Activity  . Alcohol use: Yes    Comment: occassional  . Drug use: No  . Sexual activity: Yes  Other  Topics Concern  . Not on file  Social History Narrative  . Not on file   Social Determinants of Health   Financial Resource Strain:   . Difficulty of Paying Living Expenses:   Food Insecurity:   . Worried About Charity fundraiser in the Last Year:   . Arboriculturist in the Last Year:   Transportation Needs:   . Film/video editor (Medical):   Marland Kitchen Lack of Transportation (Non-Medical):   Physical Activity:   . Days of Exercise per Week:   . Minutes of Exercise per Session:   Stress:   . Feeling of Stress :   Social Connections:   .  Frequency of Communication with Friends and Family:   . Frequency of Social Gatherings with Friends and Family:   . Attends Religious Services:   . Active Member of Clubs or Organizations:   . Attends Archivist Meetings:   Marland Kitchen Marital Status:    Allergies  Allergen Reactions  . Dust Mite Mixed Allergen Ext [Mite (D. Farinae)]   . Penicillins     Has patient had a PCN reaction causing immediate rash, facial/tongue/throat swelling, SOB or lightheadedness with hypotension: No Has patient had a PCN reaction causing severe rash involving mucus membranes or skin necrosis: No Has patient had a PCN reaction that required hospitalization No Has patient had a PCN reaction occurring within the last 10 years: No If all of the above answers are "NO", then may proceed with Cephalosporin use.    Family History  Adopted: Yes  Problem Relation Age of Onset  . Colon polyps Father   . Colon cancer Paternal Uncle   . Esophageal cancer Neg Hx   . Rectal cancer Neg Hx   . Stomach cancer Neg Hx          Current Outpatient Medications (Other):  .  phenylephrine-shark liver oil-mineral oil-petrolatum (PREPARATION H) 0.25-14-74.9 % rectal ointment, Place 1 application rectally as needed for hemorrhoids.   Reviewed prior external information including notes and imaging from  primary care provider As well as notes that were available from care everywhere and other healthcare systems.  Past medical history, social, surgical and family history all reviewed in electronic medical record.  No pertanent information unless stated regarding to the chief complaint.   Review of Systems:  No visual changes, nausea, vomiting, diarrhea, constipation, dizziness, abdominal pain, skin rash, fevers, chills, night sweats, weight loss, swollen lymph nodes, body aches, joint swelling, chest pain, shortness of breath, mood changes. POSITIVE muscle aches, headache  Objective  Blood pressure 96/70, pulse (!)  59, height 5' 6.5" (1.689 m), SpO2 97 %.   General: No apparent distress alert and oriented x3 mood and affect normal, dressed appropriately.  HEENT: Pupils equal, extraocular movements intact  Respiratory: Patient's speak in full sentences and does not appear short of breath  Cardiovascular: No lower extremity edema, non tender, no erythema  Neuro: Cranial nerves II through XII are intact, neurovascularly intact in all extremities with 2+ DTRs and 2+ pulses.  Gait normal with good balance and coordination.  MSK: Neck exam does have tenderness to palpation over the right side of the neck corresponding for the C3-C4 area more than left.  Also pain in the occipital region.  No masses appreciated.  Very one small lymph node palpated but freely movable.  Severely tender.  Negative Spurling's.  Full range of motion of the right shoulder.  Mild impingement and mild crossover.  Impression and Recommendations:     The above documentation has been reviewed and is accurate and complete [name], DO       Note: This dictation was prepared with Dragon dictation along with smaller phrase technology. Any transcriptional errors that result from this process are unintentional.

## 2019-07-21 NOTE — Patient Instructions (Addendum)
Good to see you 20mg  of Rayos at night for 2 nights 10 mg at night for 2 nights 5 mg nightly after Order CT scan  We will talk about Dr. Geanie Logan and Botox if everything is normal

## 2019-07-23 ENCOUNTER — Ambulatory Visit
Admission: RE | Admit: 2019-07-23 | Discharge: 2019-07-23 | Disposition: A | Discharge status: Home / Self Care | Payer: 59 | Source: Ambulatory Visit | Attending: Family Medicine | Admitting: Family Medicine

## 2019-07-23 DIAGNOSIS — M542 Cervicalgia: Secondary | ICD-10-CM | POA: Diagnosis not present

## 2019-07-23 DIAGNOSIS — R519 Headache, unspecified: Secondary | ICD-10-CM | POA: Diagnosis not present

## 2019-07-27 ENCOUNTER — Encounter: Payer: Self-pay | Admitting: Family Medicine

## 2019-07-28 ENCOUNTER — Encounter: Payer: Self-pay | Admitting: Neurology

## 2019-07-28 ENCOUNTER — Other Ambulatory Visit: Payer: Self-pay

## 2019-07-28 DIAGNOSIS — M542 Cervicalgia: Secondary | ICD-10-CM

## 2019-09-10 ENCOUNTER — Other Ambulatory Visit: Payer: Self-pay

## 2019-09-10 MED ORDER — GABAPENTIN 100 MG PO CAPS
200.0000 mg | ORAL_CAPSULE | Freq: Two times a day (BID) | ORAL | 0 refills | Status: DC
Start: 2019-09-10 — End: 2020-11-29

## 2019-09-10 MED FILL — GABAPENTIN 100 MG CAP: 100 | 90 days supply | Qty: 360 | Fill #0

## 2019-10-17 NOTE — Progress Notes (Deleted)
NEUROLOGY CONSULTATION NOTE  Cody [name] MRN: [id number] DOB: 06-05-1965  Referring provider: Hulan Saas, DO Primary care provider: Betty Martinique, MD  Reason for consult:  Neck pain  HISTORY OF PRESENT ILLNESS: Cody [name] is a 54 year old ***-handed male who presents for neck pain.  History supplemented by referring provider's notes.  He has gradually worsening neck pain since 2019.  ***.  He has failed all conservative therapy including meloxicam, right C7-T1 epidural injection, ***.  He has history of anxiety disorder which the pain has aggravated.   MRI of cervical spine from 01/21/2019 personally reviewed showed small disc protrusion with osteophyte at C5-6 possibly affecting the right C6 nerve but otherwise unremarkable.  Ultrasound showed calcified lymph node but no vascular abnormalities.  CT cervical spine from 07/24/2019 personally reviewed again showed moderate focal right eccentric uncovertebral change at C5-C6 but otherwise unremarkable.  CT head unremarkable.    PAST MEDICAL HISTORY: Past Medical History:  Diagnosis Date  . Claustrophobia   . Complication of anesthesia    dizzy, difficult time waking up  . Hemorrhoid 01/2019   external  . Medical history non-contributory     PAST SURGICAL HISTORY: Past Surgical History:  Procedure Laterality Date  . HERNIA REPAIR     umbicial  . LAPAROSCOPIC CHOLECYSTECTOMY    . NECK SURGERY     as a child- patient is not sure what itwas- had cat scratch fever  . RADIOLOGY WITH ANESTHESIA N/A 01/13/2016   Procedure: MRI of brain with and without and cervical spine and lumbar spine;  Surgeon: Medication Radiologist, MD;  Location: [facility];  Service: Radiology;  Laterality: N/A;  . RADIOLOGY WITH ANESTHESIA N/A 01/21/2019   Procedure: MRI WITH ANESTHESIA CERVICAL SPINE WITHOUT IV CONTRAST;  Surgeon: Radiologist, Medication, MD;  Location: [facility];  Service: Radiology;  Laterality: N/A;    MEDICATIONS: Current Outpatient  Medications on File Prior to Visit  Medication Sig Dispense Refill  . gabapentin (NEURONTIN) 100 MG capsule Take 2 capsules (200 mg total) by mouth 2 (two) times daily. 360 capsule 0  . phenylephrine-shark liver oil-mineral oil-petrolatum (PREPARATION H) 0.25-14-74.9 % rectal ointment Place 1 application rectally as needed for hemorrhoids.     No current facility-administered medications on file prior to visit.    ALLERGIES: Allergies  Allergen Reactions  . Dust Mite Mixed Allergen Ext [Mite (D. Farinae)]   . Penicillins     Has patient had a PCN reaction causing immediate rash, facial/tongue/throat swelling, SOB or lightheadedness with hypotension: No Has patient had a PCN reaction causing severe rash involving mucus membranes or skin necrosis: No Has patient had a PCN reaction that required hospitalization No Has patient had a PCN reaction occurring within the last 10 years: No If all of the above answers are "NO", then may proceed with Cephalosporin use.     FAMILY HISTORY: Family History  Adopted: Yes  Problem Relation Age of Onset  . Colon polyps Father   . Colon cancer Paternal Uncle   . Esophageal cancer Neg Hx   . Rectal cancer Neg Hx   . Stomach cancer Neg Hx    ***.  SOCIAL HISTORY: Social History   Socioeconomic History  . Marital status: Married    Spouse name: Not on file  . Number of children: Not on file  . Years of education: Not on file  . Highest education level: Not on file  Occupational History  . Not on file  Tobacco Use  .  Smoking status: Never Smoker  . Smokeless tobacco: Never Used  Vaping Use  . Vaping Use: Never used  Substance and Sexual Activity  . Alcohol use: Yes    Comment: occassional  . Drug use: No  . Sexual activity: Yes  Other Topics Concern  . Not on file  Social History Narrative  . Not on file   Social Determinants of Health   Financial Resource Strain:   . Difficulty of Paying Living Expenses: Not on file  Food  Insecurity:   . Worried About Charity fundraiser in the Last Year: Not on file  . Ran Out of Food in the Last Year: Not on file  Transportation Needs:   . Lack of Transportation (Medical): Not on file  . Lack of Transportation (Non-Medical): Not on file  Physical Activity:   . Days of Exercise per Week: Not on file  . Minutes of Exercise per Session: Not on file  Stress:   . Feeling of Stress : Not on file  Social Connections:   . Frequency of Communication with Friends and Family: Not on file  . Frequency of Social Gatherings with Friends and Family: Not on file  . Attends Religious Services: Not on file  . Active Member of Clubs or Organizations: Not on file  . Attends Archivist Meetings: Not on file  . Marital Status: Not on file  Intimate Partner Violence:   . Fear of Current or Ex-Partner: Not on file  . Emotionally Abused: Not on file  . Physically Abused: Not on file  . Sexually Abused: Not on file    REVIEW OF SYSTEMS: Constitutional: No fevers, chills, or sweats, no generalized fatigue, change in appetite Eyes: No visual changes, double vision, eye pain Ear, nose and throat: No hearing loss, ear pain, nasal congestion, sore throat Cardiovascular: No chest pain, palpitations Respiratory:  No shortness of breath at rest or with exertion, wheezes GastrointestinaI: No nausea, vomiting, diarrhea, abdominal pain, fecal incontinence Genitourinary:  No dysuria, urinary retention or frequency Musculoskeletal:  No neck pain, back pain Integumentary: No rash, pruritus, skin lesions Neurological: as above Psychiatric: No depression, insomnia, anxiety Endocrine: No palpitations, fatigue, diaphoresis, mood swings, change in appetite, change in weight, increased thirst Hematologic/Lymphatic:  No purpura, petechiae. Allergic/Immunologic: no itchy/runny eyes, nasal congestion, recent allergic reactions, rashes  PHYSICAL EXAM: *** General: No acute distress.  Patient  appears ***-groomed.  *** Head:  Normocephalic/atraumatic Eyes:  fundi examined but not visualized Neck: supple, no paraspinal tenderness, full range of motion Back: No paraspinal tenderness Heart: regular rate and rhythm Lungs: Clear to auscultation bilaterally. Vascular: No carotid bruits. Neurological Exam: Mental status: alert and oriented to person, place, and time, recent and remote memory intact, fund of knowledge intact, attention and concentration intact, speech fluent and not dysarthric, language intact. Cranial nerves: CN I: not tested CN II: pupils equal, round and reactive to light, visual fields intact CN III, IV, VI:  full range of motion, no nystagmus, no ptosis CN V: facial sensation intact CN VII: upper and lower face symmetric CN VIII: hearing intact CN IX, X: gag intact, uvula midline CN XI: sternocleidomastoid and trapezius muscles intact CN XII: tongue midline Bulk & Tone: normal, no fasciculations. Motor:  5/5 throughout *** Sensation:  Pinprick *** temperature *** and vibration sensation intact.  ***. Deep Tendon Reflexes:  2+ throughout, *** toes downgoing.  *** Finger to nose testing:  Without dysmetria.  *** Heel to shin:  Without dysmetria.  ***  Gait:  Normal station and stride.  Able to turn and tandem walk. Romberg ***.  IMPRESSION: ***  PLAN: ***  Thank you for allowing me to take part in the care of this patient.  Metta Clines, DO  CC: ***

## 2019-10-21 ENCOUNTER — Ambulatory Visit: Payer: 59 | Admitting: Neurology

## 2019-10-21 DIAGNOSIS — H6983 Other specified disorders of Eustachian tube, bilateral: Secondary | ICD-10-CM | POA: Diagnosis not present

## 2019-10-21 DIAGNOSIS — R42 Dizziness and giddiness: Secondary | ICD-10-CM | POA: Diagnosis not present

## 2019-10-21 DIAGNOSIS — J342 Deviated nasal septum: Secondary | ICD-10-CM | POA: Diagnosis not present

## 2019-10-21 DIAGNOSIS — J343 Hypertrophy of nasal turbinates: Secondary | ICD-10-CM | POA: Diagnosis not present

## 2019-10-21 MED FILL — FLUTICASONE PROP 50 MCG SPR: 50 | 30 days supply | Qty: 16 | Fill #0

## 2020-02-16 DIAGNOSIS — L578 Other skin changes due to chronic exposure to nonionizing radiation: Secondary | ICD-10-CM | POA: Diagnosis not present

## 2020-02-16 DIAGNOSIS — Z808 Family history of malignant neoplasm of other organs or systems: Secondary | ICD-10-CM | POA: Diagnosis not present

## 2020-02-16 DIAGNOSIS — L723 Sebaceous cyst: Secondary | ICD-10-CM | POA: Diagnosis not present

## 2020-02-16 DIAGNOSIS — L814 Other melanin hyperpigmentation: Secondary | ICD-10-CM | POA: Diagnosis not present

## 2020-02-16 DIAGNOSIS — D225 Melanocytic nevi of trunk: Secondary | ICD-10-CM | POA: Diagnosis not present

## 2020-02-16 DIAGNOSIS — L821 Other seborrheic keratosis: Secondary | ICD-10-CM | POA: Diagnosis not present

## 2020-02-16 DIAGNOSIS — D485 Neoplasm of uncertain behavior of skin: Secondary | ICD-10-CM | POA: Diagnosis not present

## 2020-02-19 DIAGNOSIS — M542 Cervicalgia: Secondary | ICD-10-CM | POA: Diagnosis not present

## 2020-02-19 DIAGNOSIS — M5030 Other cervical disc degeneration, unspecified cervical region: Secondary | ICD-10-CM | POA: Diagnosis not present

## 2020-03-23 DIAGNOSIS — Z20822 Contact with and (suspected) exposure to covid-19: Secondary | ICD-10-CM | POA: Diagnosis not present

## 2020-04-08 DIAGNOSIS — H524 Presbyopia: Secondary | ICD-10-CM | POA: Diagnosis not present

## 2020-04-08 DIAGNOSIS — M5412 Radiculopathy, cervical region: Secondary | ICD-10-CM | POA: Diagnosis not present

## 2020-04-13 DIAGNOSIS — M5030 Other cervical disc degeneration, unspecified cervical region: Secondary | ICD-10-CM | POA: Diagnosis not present

## 2020-04-15 DIAGNOSIS — D225 Melanocytic nevi of trunk: Secondary | ICD-10-CM | POA: Diagnosis not present

## 2020-04-15 DIAGNOSIS — D485 Neoplasm of uncertain behavior of skin: Secondary | ICD-10-CM | POA: Diagnosis not present

## 2020-06-08 DIAGNOSIS — M542 Cervicalgia: Secondary | ICD-10-CM | POA: Diagnosis not present

## 2020-06-16 DIAGNOSIS — M542 Cervicalgia: Secondary | ICD-10-CM | POA: Diagnosis not present

## 2020-06-18 DIAGNOSIS — M542 Cervicalgia: Secondary | ICD-10-CM | POA: Diagnosis not present

## 2020-06-21 DIAGNOSIS — M542 Cervicalgia: Secondary | ICD-10-CM | POA: Diagnosis not present

## 2020-06-24 DIAGNOSIS — M542 Cervicalgia: Secondary | ICD-10-CM | POA: Diagnosis not present

## 2020-07-06 DIAGNOSIS — M542 Cervicalgia: Secondary | ICD-10-CM | POA: Diagnosis not present

## 2020-07-09 DIAGNOSIS — M542 Cervicalgia: Secondary | ICD-10-CM | POA: Diagnosis not present

## 2020-07-14 DIAGNOSIS — M542 Cervicalgia: Secondary | ICD-10-CM | POA: Diagnosis not present

## 2020-07-16 DIAGNOSIS — M5030 Other cervical disc degeneration, unspecified cervical region: Secondary | ICD-10-CM | POA: Diagnosis not present

## 2020-08-10 ENCOUNTER — Telehealth: Payer: 59 | Admitting: Physician Assistant

## 2020-08-10 DIAGNOSIS — B9689 Other specified bacterial agents as the cause of diseases classified elsewhere: Secondary | ICD-10-CM | POA: Diagnosis not present

## 2020-08-10 DIAGNOSIS — J019 Acute sinusitis, unspecified: Secondary | ICD-10-CM

## 2020-08-10 MED ORDER — DOXYCYCLINE HYCLATE 100 MG PO CAPS: 100.0000 mg | ORAL_CAPSULE | Freq: Two times a day (BID) | ORAL | 0 refills | Status: DC

## 2020-08-10 NOTE — Addendum Note (Signed)
Addended by: Brunetta Jeans on: 08/10/2020 07:56 AM   Modules accepted: Orders

## 2020-08-10 NOTE — Progress Notes (Signed)
I have spent 5 minutes in review of e-visit questionnaire, review and updating patient chart, medical decision making and response to patient.   Kynzley Dowson Cody Mattthew, PA-C    

## 2020-08-10 NOTE — Progress Notes (Signed)

## 2020-10-12 ENCOUNTER — Ambulatory Visit: Payer: 59 | Admitting: Family Medicine

## 2020-11-10 ENCOUNTER — Ambulatory Visit (INDEPENDENT_AMBULATORY_CARE_PROVIDER_SITE_OTHER): Payer: 59

## 2020-11-10 ENCOUNTER — Ambulatory Visit: Payer: 59 | Admitting: Family Medicine

## 2020-11-10 ENCOUNTER — Other Ambulatory Visit: Payer: Self-pay

## 2020-11-10 DIAGNOSIS — M19042 Primary osteoarthritis, left hand: Secondary | ICD-10-CM | POA: Insufficient documentation

## 2020-11-10 DIAGNOSIS — M7712 Lateral epicondylitis, left elbow: Secondary | ICD-10-CM

## 2020-11-10 DIAGNOSIS — M25522 Pain in left elbow: Secondary | ICD-10-CM

## 2020-11-10 DIAGNOSIS — M7710 Lateral epicondylitis, unspecified elbow: Secondary | ICD-10-CM | POA: Insufficient documentation

## 2020-11-10 NOTE — Patient Instructions (Signed)
Wear with repetitive motion Do prescribed exercises at least 3x a week See you again in 6 weeks just in case

## 2020-11-10 NOTE — Assessment & Plan Note (Signed)
Elbow anatomy was reviewed, and tendinopathy was explained.  Pt. given a home rehab program. Start with isometrics and ROM, then a series of concentric and eccentric exercises should be done starting with no weight, work up to 1 lb, hammer, etc.  Use counterforce strap if working or using hands.  Formal PT would be beneficial. Emphasized stretching an cross-friction massage Emphasized proper palms up lifting biomechanics to unload ECRB Follow-up with me again in 4 weeks and if worsening pain consider injection

## 2020-11-10 NOTE — Progress Notes (Signed)
[place] Phone: [phone] Subjective:    I'm seeing this patient by the request  of:  Martinique, [name], MD  CC: Left finger and left elbow pain  BBC:[id number]  Cody [name] is a 55 y.o. male coming in with complaint of left elbow pain.  Been going on for some months.  May have injured it when he was lifting.  Does not remember it specifically.  States he does not notice exactly which activities to give him trouble the patient does do bow hunting.  Patient states that when he tries to pull back on the hands and hold he starts having some discomfort.  Also has more of the right finger pain.  Seems to be more of the DIP.  Describes the pain as a dull, aching sensation but does have severe tenderness to even light sensation now which is new.  Has had difficulty with mild movement for this for some time.  Probably been going on for many months but does not know if its been years.       Past Medical History:  Diagnosis Date   Claustrophobia    Complication of anesthesia    dizzy, difficult time waking up   Hemorrhoid 01/2019   external   Medical history non-contributory    Past Surgical History:  Procedure Laterality Date   HERNIA REPAIR     umbicial   LAPAROSCOPIC CHOLECYSTECTOMY     NECK SURGERY     as a child- patient is not sure what itwas- had cat scratch fever   RADIOLOGY WITH ANESTHESIA N/A 01/13/2016   Procedure: MRI of brain with and without and cervical spine and lumbar spine;  Surgeon: Medication Radiologist, MD;  Location: [facility];  Service: Radiology;  Laterality: N/A;   RADIOLOGY WITH ANESTHESIA N/A 01/21/2019   Procedure: MRI WITH ANESTHESIA CERVICAL SPINE WITHOUT IV CONTRAST;  Surgeon: Radiologist, Medication, MD;  Location: [facility];  Service: Radiology;  Laterality: N/A;   Social History   Socioeconomic History   Marital status: Married    Spouse name: Not on file   Number of children: Not  on file   Years of education: Not on file   Highest education level: Not on file  Occupational History   Not on file  Tobacco Use   Smoking status: Never   Smokeless tobacco: Never  Vaping Use   Vaping Use: Never used  Substance and Sexual Activity   Alcohol use: Yes    Comment: occassional   Drug use: No   Sexual activity: Yes  Other Topics Concern   Not on file  Social History Narrative   Not on file   Social Determinants of Health   Financial Resource Strain: Not on file  Food Insecurity: Not on file  Transportation Needs: Not on file  Physical Activity: Not on file  Stress: Not on file  Social Connections: Not on file   Allergies  Allergen Reactions   Dust Mite Mixed Allergen Ext [Mite (D. Farinae)]    Penicillins     Has patient had a PCN reaction causing immediate rash, facial/tongue/throat swelling, SOB or lightheadedness with hypotension: No Has patient had a PCN reaction causing severe rash involving mucus membranes or skin necrosis: No Has patient had a PCN reaction that required hospitalization No Has patient had a PCN reaction occurring within the last 10 years: No If all of the above answers are "NO", then may proceed with Cephalosporin  use.    Family History  Adopted: Yes  Problem Relation Age of Onset   Colon polyps Father    Colon cancer Paternal Uncle    Esophageal cancer Neg Hx    Rectal cancer Neg Hx    Stomach cancer Neg Hx          Current Outpatient Medications (Other):    doxycycline (VIBRAMYCIN) 100 MG capsule, Take 1 capsule (100 mg total) by mouth 2 (two) times daily.   gabapentin (NEURONTIN) 100 MG capsule, Take 2 capsules (200 mg total) by mouth 2 (two) times daily.   phenylephrine-shark liver oil-mineral oil-petrolatum (PREPARATION H) 0.25-14-74.9 % rectal ointment, Place 1 application rectally as needed for hemorrhoids.    Objective    General: No apparent distress alert and oriented x3 mood and affect normal, dressed  appropriately.  HEENT: Pupils equal, extraocular movements intact  Respiratory: Patient's speak in full sentences and does not appear short of breath  Cardiovascular: No lower extremity edema, non tender, no erythema  Gait normal with good balance and coordination.  MSK: Patient does have tenderness noted over the lateral epicondylar region on the left side.  Patient does have pain though with also full supination on this area.  Patient's grip strength is full though.  Only mild discomfort with resisted extension of the wrist. Fifth DIP does have mild arthritic cyst noted.  Limited range of motion of the fifth DIP.  Limited muscular skeletal ultrasound was performed and interpreted by Hulan Saas, M  Limited ultrasound of patient's lateral epicondylar region seems to have a very small avulsion fracture noted of the lateral epicondylar region very mild increase in Doppler flow over the common extensor tendon but no true tear appreciated. Regarding patient's fifth DIP on the left side patient does have moderate to severe narrowing noted of the joint.    Impression and Recommendations:     The above documentation has been reviewed and is accurate and complete Cody Pulley, DO

## 2020-11-10 NOTE — Assessment & Plan Note (Signed)
I believe the patient does have some underlying arthritis.  Discussed potential bracing which patient did not seem to enjoy.  We discussed the possibility of injections or get severe.  I do not feel that imaging is warranted at the moment but we can consider it at some point.  Follow-up with me again in 4-6

## 2020-11-26 ENCOUNTER — Telehealth: Payer: Self-pay

## 2020-11-26 NOTE — Telephone Encounter (Signed)
I spoke with the pt and he stated he talked with a triage nurse that advised him to visit the ED if he continues to have SOB and Chest pain/ pressure or tightness. I advised pt to visit ED if his symptoms progress or worsen. Pt agreed and stated he would. Pt has been scheduled for appt on 10/17 with PCP.

## 2020-11-26 NOTE — Telephone Encounter (Signed)
Patient called to schedule an appt today for shortness of breath and chest pain and no appts were available pt was transfered to a triage nurse and after being told to go to an Urgent care he stated he does not have time to go and wants to schedule an appt for next week.

## 2020-11-29 ENCOUNTER — Ambulatory Visit: Payer: 59 | Admitting: Family Medicine

## 2020-11-29 ENCOUNTER — Ambulatory Visit (INDEPENDENT_AMBULATORY_CARE_PROVIDER_SITE_OTHER): Payer: 59

## 2020-11-29 ENCOUNTER — Encounter: Payer: Self-pay | Admitting: Family Medicine

## 2020-11-29 ENCOUNTER — Other Ambulatory Visit: Payer: Self-pay

## 2020-11-29 VITALS — BP 130/82 | HR 64 | Temp 98.0°F | Resp 16 | Ht 66.5 in | Wt 213.0 lb

## 2020-11-29 DIAGNOSIS — R0600 Dyspnea, unspecified: Secondary | ICD-10-CM | POA: Diagnosis not present

## 2020-11-29 DIAGNOSIS — E6609 Other obesity due to excess calories: Secondary | ICD-10-CM

## 2020-11-29 DIAGNOSIS — Z6833 Body mass index (BMI) 33.0-33.9, adult: Secondary | ICD-10-CM | POA: Diagnosis not present

## 2020-11-29 DIAGNOSIS — E66811 Obesity, class 1: Secondary | ICD-10-CM | POA: Insufficient documentation

## 2020-11-29 DIAGNOSIS — Z13220 Encounter for screening for lipoid disorders: Secondary | ICD-10-CM | POA: Diagnosis not present

## 2020-11-29 DIAGNOSIS — Z419 Encounter for procedure for purposes other than remedying health state, unspecified: Secondary | ICD-10-CM

## 2020-11-29 DIAGNOSIS — R0602 Shortness of breath: Secondary | ICD-10-CM

## 2020-11-29 DIAGNOSIS — E669 Obesity, unspecified: Secondary | ICD-10-CM | POA: Insufficient documentation

## 2020-11-29 LAB — LIPID PANEL
Cholesterol: 183 mg/dL (ref 0–200)
HDL: 54.1 mg/dL (ref >39.00)
LDL Cholesterol: 112 mg/dL — ABNORMAL HIGH (ref 0–99)
NonHDL: 129.2
Total CHOL/HDL Ratio: 3
Triglycerides: 86 mg/dL (ref 0.0–149.0)
VLDL: 17.2 mg/dL (ref 0.0–40.0)

## 2020-11-29 LAB — TSH: TSH: 1.92 u[IU]/mL (ref 0.35–5.50)

## 2020-11-29 LAB — BASIC METABOLIC PANEL
BUN: 20 mg/dL (ref 6–23)
CO2: 27 mEq/L (ref 19–32)
Calcium: 9.4 mg/dL (ref 8.4–10.5)
Chloride: 104 mEq/L (ref 96–112)
Creatinine, Ser: 1.08 mg/dL (ref 0.40–1.50)
GFR: 78.45 mL/min (ref >60.00)
Glucose, Bld: 90 mg/dL (ref 70–99)
Potassium: 4.1 mEq/L (ref 3.5–5.1)
Sodium: 139 mEq/L (ref 135–145)

## 2020-11-29 LAB — CBC
HCT: 41.3 % (ref 39.0–52.0)
Hemoglobin: 14.3 g/dL (ref 13.0–17.0)
MCHC: 34.5 g/dL (ref 30.0–36.0)
MCV: 87.6 fl (ref 78.0–100.0)
Platelets: 201 10*3/uL (ref 150.0–400.0)
RBC: 4.72 Mil/uL (ref 4.22–5.81)
RDW: 12.5 % (ref 11.5–15.5)
WBC: 5.8 10*3/uL (ref 4.0–10.5)

## 2020-11-29 NOTE — Progress Notes (Signed)
ACUTE VISIT Chief Complaint  Patient presents with   Follow-up   Shortness of Breath   HPI: [name] is a 55 y.o. male, who is here today complaining of 2 weeks of feeling the need of taking deep breaths, sigh. This happens a few times through the day. No associated symptoms.  Sometimes wakes up in the middle of the night "gasping for air." No known hx of OSA. He has not identified exacerbating or alleviating factors.  Shortness of Breath This is a new problem. The current episode started 1 to 4 weeks ago. The problem occurs intermittently. The problem has been unchanged. Pertinent negatives include no chest pain, claudication, ear pain, fever, headaches, hemoptysis, leg pain, leg swelling, neck pain, orthopnea, PND, rash, rhinorrhea, sore throat, sputum production, swollen glands, syncope, vomiting or wheezing. Nothing aggravates the symptoms. The patient has no known risk factors for DVT/PE. There is no history of allergies, asthma, CAD, chronic lung disease, COPD, a heart failure, PE or a recent surgery.   Negative for recent URI or  travel. He walks 4 miles every morning and has no symptoms while he is doing so. He has not been consistent with following a healthful diet and has gained some wt, wonders if this is a contributing factor.  He also would like a coronary calcium score and lipid panel check.  Lab Results  Component Value Date   CHOL 198 11/08/2018   HDL 52.20 11/08/2018   LDLCALC 122 (H) 11/08/2018   TRIG 118.0 11/08/2018   CHOLHDL 4 11/08/2018   -Abdominal discomfort: He feels like "something is not right in my stomach" some discomfort that is alleviated by movement. He has no pain,N/V,or changes in bowel habits.  Negative for heartburn.  He started taking Omeprazole for a week. He is sleeping 5-6 hours. "Little tired" during the day but feels rested in the morning.  Review of Systems  Constitutional:  Negative for activity change, appetite change,  diaphoresis, fever and unexpected weight change.  HENT:  Negative for ear pain, rhinorrhea and sore throat.   Respiratory:  Positive for shortness of breath. Negative for hemoptysis, sputum production and wheezing.   Cardiovascular:  Negative for chest pain, orthopnea, claudication, leg swelling, syncope and PND.  Gastrointestinal:  Negative for vomiting.  Genitourinary:  Negative for decreased urine volume, dysuria and hematuria.  Musculoskeletal:  Negative for neck pain.  Skin:  Negative for rash.  Neurological:  Negative for syncope, weakness and headaches.  Hematological:  Negative for adenopathy. Does not bruise/bleed easily.  Rest see pertinent positives and negatives per HPI.  Current Outpatient Medications on File Prior to Visit  Medication Sig Dispense Refill   omeprazole (PRILOSEC) 10 MG capsule Take 10 mg by mouth daily.     No current facility-administered medications on file prior to visit.   Past Medical History:  Diagnosis Date   Claustrophobia    Complication of anesthesia    dizzy, difficult time waking up   Hemorrhoid 01/2019   external   Medical history non-contributory    Allergies  Allergen Reactions   Dust Mite Mixed Allergen Ext [Mite (D. Farinae)]    Penicillins     Has patient had a PCN reaction causing immediate rash, facial/tongue/throat swelling, SOB or lightheadedness with hypotension: No Has patient had a PCN reaction causing severe rash involving mucus membranes or skin necrosis: No Has patient had a PCN reaction that required hospitalization No Has patient had a PCN reaction occurring within the last 10 years: No  If all of the above answers are "NO", then may proceed with Cephalosporin use.     Social History   Socioeconomic History   Marital status: Married    Spouse name: Not on file   Number of children: Not on file   Years of education: Not on file   Highest education level: Not on file  Occupational History   Not on file  Tobacco  Use   Smoking status: Never   Smokeless tobacco: Never  Vaping Use   Vaping Use: Never used  Substance and Sexual Activity   Alcohol use: Yes    Comment: occassional   Drug use: No   Sexual activity: Yes  Other Topics Concern   Not on file  Social History Narrative   Not on file   Social Determinants of Health   Financial Resource Strain: Not on file  Food Insecurity: Not on file  Transportation Needs: Not on file  Physical Activity: Not on file  Stress: Not on file  Social Connections: Not on file   Vitals:   11/29/20 0935 11/29/20 1042  BP: 130/82   Pulse: 64   Resp: 16   Temp: 98 F (36.7 C)   SpO2: 94% 97%   Wt Readings from Last 3 Encounters:  11/29/20 213 lb (96.6 kg)  03/13/19 205 lb (93 kg)  02/27/19 205 lb (93 kg)   Body mass index is 33.86 kg/m.  Physical Exam Vitals and nursing note reviewed.  Constitutional:      General: He is not in acute distress.    Appearance: He is well-developed.  HENT:     Head: Normocephalic and atraumatic.     Mouth/Throat:     Mouth: Mucous membranes are moist.     Pharynx: Oropharynx is clear.  Eyes:     Conjunctiva/sclera: Conjunctivae normal.  Neck:     Vascular: No JVD.  Cardiovascular:     Rate and Rhythm: Normal rate and regular rhythm.     Pulses:          Dorsalis pedis pulses are 2+ on the right side and 2+ on the left side.     Heart sounds: No murmur heard.    Comments: Negative for calves pain with foot dorsiflexion. Pulmonary:     Effort: Pulmonary effort is normal. No respiratory distress.     Breath sounds: Normal breath sounds.  Abdominal:     Palpations: Abdomen is soft. There is no hepatomegaly or mass.     Tenderness: There is no abdominal tenderness.  Musculoskeletal:     Right lower leg: No edema.     Left lower leg: No edema.  Lymphadenopathy:     Cervical: No cervical adenopathy.     Upper Body:     Right upper body: No supraclavicular adenopathy.     Left upper body: No  supraclavicular adenopathy.  Skin:    General: Skin is warm.     Findings: No erythema or rash.  Neurological:     Mental Status: He is alert and oriented to person, place, and time.     Cranial Nerves: No cranial nerve deficit.     Gait: Gait normal.  Psychiatric:     Comments: Well groomed, good eye contact.   ASSESSMENT AND PLAN:  [name] was seen today for follow-up and shortness of breath.  Diagnoses and all orders for this visit: Orders Placed This Encounter  Procedures   CT CARDIAC SCORING (SELF PAY ONLY)   DG Chest 2 View  Basic metabolic panel   CBC   Lipid panel   TSH   Lab Results  Component Value Date   TSH 1.92 11/29/2020   Lab Results  Component Value Date   WBC 5.8 11/29/2020   HGB 14.3 11/29/2020   HCT 41.3 11/29/2020   MCV 87.6 11/29/2020   PLT 201.0 11/29/2020   Lab Results  Component Value Date   CREATININE 1.08 11/29/2020   BUN 20 11/29/2020   NA 139 11/29/2020   K 4.1 11/29/2020   CL 104 11/29/2020   CO2 27 11/29/2020   Lab Results  Component Value Date   CHOL 183 11/29/2020   HDL 54.10 11/29/2020   LDLCALC 112 (H) 11/29/2020   TRIG 86.0 11/29/2020   CHOLHDL 3 11/29/2020   The 10-year ASCVD risk score (Arnett DK, et al., 2019) is: 4%   Values used to calculate the score:     Age: 20 years     Sex: Male     Is Non-Hispanic African American: No     Diabetic: No     Tobacco smoker: No     Systolic Blood Pressure: 759 mmHg     Is BP treated: No     HDL Cholesterol: 54.1 mg/dL     Total Cholesterol: 183 mg/dL  Shortness of breath We discussed possible etiologies. ? OSA,RAD. The probability of this being cardia or thrombotic related is low but never zero. He agrees with holding on D-dimer and chest CTA. Hx and examination today do not suggest a serious process.  Wt loss definitively will help. Further recommendations according to lab/CXR results. Instructed about warning signs.  Upper abdominal discomfort: ?  Musculoskeletal,dyspepsia. Recommend completing 8 weeks of Omeprazole 20 mg daily. GERD precautions. Monitor for new symptoms.  Lipid screening -     Lipid panel  Class 1 obesity due to excess calories with serious comorbidity and body mass index (BMI) of 33.0 to 33.9 in adult He understands the benefits of wt loss as well as adverse effects of obesity. Consistency with healthy diet and physical activity encouraged.  Patient-requested procedure -     CT CARDIAC SCORING (SELF PAY ONLY); Future  Return if symptoms worsen or fail to improve.  Randolf Sansoucie G. Martinique, MD  [facility]. [place] office.  Discharge Instructions   None

## 2020-11-29 NOTE — Patient Instructions (Signed)
A few things to remember from today's visit:   Shortness of breath - Plan: Basic metabolic panel, CBC, TSH, DG Chest 2 View  Patient-requested procedure - Plan: CT CARDIAC SCORING (SELF PAY ONLY)  Lipid screening - Plan: Lipid panel  Class 1 obesity due to excess calories with serious comorbidity and body mass index (BMI) of 33.0 to 33.9 in adult  If you need refills please call your pharmacy. Do not use My Chart to request refills or for acute issues that need immediate attention.   Complete 8 weeks of Omeprazole 20 mg daily. Mild sleep apnea could be causing symptoms. It does not seem to be heart related. Wt loss will help. Further work up depending of lab results.  Please be sure medication list is accurate. If a new problem present, please set up appointment sooner than planned today.

## 2021-01-04 ENCOUNTER — Ambulatory Visit
Admission: RE | Admit: 2021-01-04 | Discharge: 2021-01-04 | Disposition: A | Discharge status: Home / Self Care | Payer: Self-pay | Source: Ambulatory Visit | Attending: Family Medicine | Admitting: Family Medicine

## 2021-01-04 ENCOUNTER — Other Ambulatory Visit: Payer: Self-pay

## 2021-01-04 DIAGNOSIS — Z419 Encounter for procedure for purposes other than remedying health state, unspecified: Secondary | ICD-10-CM

## 2021-01-19 DIAGNOSIS — M9904 Segmental and somatic dysfunction of sacral region: Secondary | ICD-10-CM | POA: Diagnosis not present

## 2021-01-19 DIAGNOSIS — M791 Myalgia, unspecified site: Secondary | ICD-10-CM | POA: Diagnosis not present

## 2021-01-19 DIAGNOSIS — M5417 Radiculopathy, lumbosacral region: Secondary | ICD-10-CM | POA: Diagnosis not present

## 2021-01-19 DIAGNOSIS — M9902 Segmental and somatic dysfunction of thoracic region: Secondary | ICD-10-CM | POA: Diagnosis not present

## 2021-01-19 DIAGNOSIS — M9903 Segmental and somatic dysfunction of lumbar region: Secondary | ICD-10-CM | POA: Diagnosis not present

## 2021-01-20 DIAGNOSIS — M5417 Radiculopathy, lumbosacral region: Secondary | ICD-10-CM | POA: Diagnosis not present

## 2021-01-20 DIAGNOSIS — M9903 Segmental and somatic dysfunction of lumbar region: Secondary | ICD-10-CM | POA: Diagnosis not present

## 2021-01-20 DIAGNOSIS — M9904 Segmental and somatic dysfunction of sacral region: Secondary | ICD-10-CM | POA: Diagnosis not present

## 2021-01-20 DIAGNOSIS — M791 Myalgia, unspecified site: Secondary | ICD-10-CM | POA: Diagnosis not present

## 2021-01-20 DIAGNOSIS — M9902 Segmental and somatic dysfunction of thoracic region: Secondary | ICD-10-CM | POA: Diagnosis not present

## 2021-01-21 DIAGNOSIS — M9903 Segmental and somatic dysfunction of lumbar region: Secondary | ICD-10-CM | POA: Diagnosis not present

## 2021-01-21 DIAGNOSIS — M9902 Segmental and somatic dysfunction of thoracic region: Secondary | ICD-10-CM | POA: Diagnosis not present

## 2021-01-21 DIAGNOSIS — M9904 Segmental and somatic dysfunction of sacral region: Secondary | ICD-10-CM | POA: Diagnosis not present

## 2021-01-21 DIAGNOSIS — M791 Myalgia, unspecified site: Secondary | ICD-10-CM | POA: Diagnosis not present

## 2021-01-21 DIAGNOSIS — M5417 Radiculopathy, lumbosacral region: Secondary | ICD-10-CM | POA: Diagnosis not present

## 2021-01-24 DIAGNOSIS — M5417 Radiculopathy, lumbosacral region: Secondary | ICD-10-CM | POA: Diagnosis not present

## 2021-01-24 DIAGNOSIS — M9902 Segmental and somatic dysfunction of thoracic region: Secondary | ICD-10-CM | POA: Diagnosis not present

## 2021-01-24 DIAGNOSIS — M791 Myalgia, unspecified site: Secondary | ICD-10-CM | POA: Diagnosis not present

## 2021-01-24 DIAGNOSIS — M9903 Segmental and somatic dysfunction of lumbar region: Secondary | ICD-10-CM | POA: Diagnosis not present

## 2021-01-24 DIAGNOSIS — M9904 Segmental and somatic dysfunction of sacral region: Secondary | ICD-10-CM | POA: Diagnosis not present

## 2021-01-25 NOTE — Progress Notes (Signed)
[place] Phone: [phone] Subjective:   Cody [name], am serving as a scribe for Dr. Hulan Saas.  This visit occurred during the SARS-CoV-2 public health emergency.  Safety protocols were in place, including screening questions prior to the visit, additional usage of staff PPE, and extensive cleaning of exam room while observing appropriate contact time as indicated for disinfecting solutions.    I'm seeing this patient by the request  of:  Cody [name], Cody G, MD  CC: Low back pain  GUR:[id number]  Cody [name] is a 55 y.o. male coming in with complaint of lower back pain. Last seen in September 2022 for L elbow pain. Patient states that he was running a couple weeks ago and he felt that since then his pelvis has been rotated. Patient has tried prednisone and gabapentin as well as chiropractic care which has helped but he is not 100%. Did see chiropractor this morning.  Patient is scared to trying to increase any activity such as running again.  Patient is also supposed to be traveling for the holidays and wants to make sure that he is better for it.     Past Medical History:  Diagnosis Date   Claustrophobia    Complication of anesthesia    dizzy, difficult time waking up   Hemorrhoid 01/2019   external   Medical history non-contributory    Past Surgical History:  Procedure Laterality Date   HERNIA REPAIR     umbicial   LAPAROSCOPIC CHOLECYSTECTOMY     NECK SURGERY     as a child- patient is not sure what itwas- had cat scratch fever   RADIOLOGY WITH ANESTHESIA N/A 01/13/2016   Procedure: MRI of brain with and without and cervical spine and lumbar spine;  Surgeon: Medication Radiologist, MD;  Location: [facility];  Service: Radiology;  Laterality: N/A;   RADIOLOGY WITH ANESTHESIA N/A 01/21/2019   Procedure: MRI WITH ANESTHESIA CERVICAL SPINE WITHOUT IV CONTRAST;  Surgeon: Radiologist, Medication, MD;   Location: [facility];  Service: Radiology;  Laterality: N/A;   Social History   Socioeconomic History   Marital status: Married    Spouse name: Not on file   Number of children: Not on file   Years of education: Not on file   Highest education level: Not on file  Occupational History   Not on file  Tobacco Use   Smoking status: Never   Smokeless tobacco: Never  Vaping Use   Vaping Use: Never used  Substance and Sexual Activity   Alcohol use: Yes    Comment: occassional   Drug use: [name]   Sexual activity: Yes  Other Topics Concern   Not on file  Social History Narrative   Not on file   Social Determinants of Health   Financial Resource Strain: Not on file  Food Insecurity: Not on file  Transportation Needs: Not on file  Physical Activity: Not on file  Stress: Not on file  Social Connections: Not on file   Allergies  Allergen Reactions   Dust Mite Mixed Allergen Ext [Mite (D. Farinae)]    Penicillins     Has patient had a PCN reaction causing immediate rash, facial/tongue/throat swelling, SOB or lightheadedness with hypotension: [name] Has patient had a PCN reaction causing severe rash involving mucus membranes or skin necrosis: [name] Has patient had a PCN reaction that required hospitalization [name] Has patient had a PCN reaction occurring within the last 10  years: [name] If all of the above answers are "[name]", then may proceed with Cephalosporin use.    Family History  Adopted: Yes  Problem Relation Age of Onset   Colon polyps Father    Cancer Paternal Uncle        colon at 20.   Colon cancer Paternal Uncle    Esophageal cancer Neg Hx    Rectal cancer Neg Hx    Stomach cancer Neg Hx     Current Outpatient Medications (Endocrine & Metabolic):    predniSONE (DELTASONE) 50 MG tablet, Take one tablet a day for the next 5 days.      Current Outpatient Medications (Other):    omeprazole (PRILOSEC) 10 MG capsule, Take 10 mg by mouth daily.   Reviewed prior external information  including notes and imaging from  primary care provider As well as notes that were available from care everywhere and other healthcare systems.  Past medical history, social, surgical and family history all reviewed in electronic medical record.  [name] pertanent information unless stated regarding to the chief complaint.   Review of Systems:  [name] headache, visual changes, nausea, vomiting, diarrhea, constipation, dizziness, abdominal pain, skin rash, fevers, chills, night sweats, weight loss, swollen lymph nodes, body aches, joint swelling, chest pain, shortness of breath, mood changes. POSITIVE muscle aches  Objective  Blood pressure 104/80, pulse 73, height 5' 6.5" (1.689 m), SpO2 97 %.   General: [name] apparent distress alert and oriented x3 mood and affect normal, dressed appropriately.  HEENT: Pupils equal, extraocular movements intact  Respiratory: Patient's speak in full sentences and does not appear short of breath  Cardiovascular: [name] lower extremity edema, non tender, [name] erythema  Gait normal with good balance and coordination.  MSK: Low back exam does have some mild loss of lordosis.  Tightness noted around the left sacroiliac joint.  More tenderness over the left sacroiliac joint as well as a positive [name] but patient does have more tightness noted on the right side.  Neurovascularly intact distally.    Impression and Recommendations:     The above documentation has been reviewed and is accurate and complete [name], DO

## 2021-01-26 ENCOUNTER — Other Ambulatory Visit (HOSPITAL_COMMUNITY): Payer: Self-pay

## 2021-01-26 ENCOUNTER — Encounter: Payer: Self-pay | Admitting: Family Medicine

## 2021-01-26 ENCOUNTER — Other Ambulatory Visit: Payer: Self-pay

## 2021-01-26 ENCOUNTER — Ambulatory Visit: Payer: 59 | Admitting: Family Medicine

## 2021-01-26 VITALS — BP 104/80 | HR 73 | Ht 66.5 in

## 2021-01-26 DIAGNOSIS — M9902 Segmental and somatic dysfunction of thoracic region: Secondary | ICD-10-CM | POA: Diagnosis not present

## 2021-01-26 DIAGNOSIS — M545 Low back pain, unspecified: Secondary | ICD-10-CM

## 2021-01-26 DIAGNOSIS — M5441 Lumbago with sciatica, right side: Secondary | ICD-10-CM | POA: Diagnosis not present

## 2021-01-26 DIAGNOSIS — G8929 Other chronic pain: Secondary | ICD-10-CM

## 2021-01-26 DIAGNOSIS — M9903 Segmental and somatic dysfunction of lumbar region: Secondary | ICD-10-CM | POA: Diagnosis not present

## 2021-01-26 DIAGNOSIS — M9904 Segmental and somatic dysfunction of sacral region: Secondary | ICD-10-CM | POA: Diagnosis not present

## 2021-01-26 DIAGNOSIS — M5417 Radiculopathy, lumbosacral region: Secondary | ICD-10-CM | POA: Diagnosis not present

## 2021-01-26 DIAGNOSIS — M791 Myalgia, unspecified site: Secondary | ICD-10-CM | POA: Diagnosis not present

## 2021-01-26 MED ORDER — PREDNISONE 50 MG PO TABS
ORAL_TABLET | ORAL | 0 refills | Status: DC
  Filled 2021-01-26: qty 5, 5d supply, fill #0

## 2021-01-26 MED ORDER — METHYLPREDNISOLONE ACETATE 40 MG/ML IJ SUSP
40.0000 mg | Freq: Once | INTRAMUSCULAR | Status: AC
  Administered 2021-01-26: 11:00:00 40 mg via INTRAMUSCULAR

## 2021-01-26 MED ORDER — KETOROLAC TROMETHAMINE 30 MG/ML IJ SOLN
30.0000 mg | Freq: Once | INTRAMUSCULAR | Status: AC
  Administered 2021-01-26: 11:00:00 30 mg via INTRAMUSCULAR

## 2021-01-26 NOTE — Patient Instructions (Signed)
Injections today Prednisone 50mg  for 5 days Only take prednisone if you need it Exercises See me again in 3-4 weeks

## 2021-01-26 NOTE — Assessment & Plan Note (Signed)
Patient does have signs and symptoms consistent with back spasm.  Patient did bring in x-rays today and we were able to independently visualized some pictures he had on his phone showing the patient does have some degenerative disc disease at L4-L5.  No signs of any spondylolisthesis noted.  Patient does have some tightness noted with FABER test on the right side and may be some increase in tightness with straight leg test on the right side but patient is more tender over the left sacroiliac joint.  Has been seen chiropractor with maybe some mild improvement and prednisone has been helpful as well.  Discussed icing regimen and home exercises.  Discussed with patient about different treatment options and patient has elected to try the Toradol and Depo-Medrol.  Patient declined any type of osteopathic manipulation with him seeing 2 different chiropractors already.  We discussed also another potential course of prednisone with patient going out of town.  We will see how patient responds.  Worsening symptoms may need to consider the possibility of advanced imaging.  Patient has taken gabapentin and encouraged him to continue it at night.  Discussed the importance of weight loss.  Follow-up again in 2 to 4 weeks

## 2021-01-27 ENCOUNTER — Ambulatory Visit: Payer: 59

## 2021-01-27 ENCOUNTER — Other Ambulatory Visit: Payer: Self-pay

## 2021-01-27 DIAGNOSIS — M545 Low back pain, unspecified: Secondary | ICD-10-CM

## 2021-01-27 DIAGNOSIS — M25551 Pain in right hip: Secondary | ICD-10-CM | POA: Diagnosis not present

## 2021-01-27 DIAGNOSIS — M9903 Segmental and somatic dysfunction of lumbar region: Secondary | ICD-10-CM | POA: Diagnosis not present

## 2021-01-27 DIAGNOSIS — M9905 Segmental and somatic dysfunction of pelvic region: Secondary | ICD-10-CM | POA: Diagnosis not present

## 2021-01-27 DIAGNOSIS — M5136 Other intervertebral disc degeneration, lumbar region: Secondary | ICD-10-CM | POA: Diagnosis not present

## 2021-01-28 ENCOUNTER — Other Ambulatory Visit: Payer: Self-pay

## 2021-01-28 DIAGNOSIS — M545 Low back pain, unspecified: Secondary | ICD-10-CM

## 2021-01-28 DIAGNOSIS — M25552 Pain in left hip: Secondary | ICD-10-CM

## 2021-01-28 NOTE — Progress Notes (Unsigned)
CT pel

## 2021-01-31 ENCOUNTER — Other Ambulatory Visit: Payer: 59

## 2021-01-31 DIAGNOSIS — M5136 Other intervertebral disc degeneration, lumbar region: Secondary | ICD-10-CM | POA: Diagnosis not present

## 2021-01-31 DIAGNOSIS — M9903 Segmental and somatic dysfunction of lumbar region: Secondary | ICD-10-CM | POA: Diagnosis not present

## 2021-01-31 DIAGNOSIS — M9905 Segmental and somatic dysfunction of pelvic region: Secondary | ICD-10-CM | POA: Diagnosis not present

## 2021-01-31 DIAGNOSIS — M25551 Pain in right hip: Secondary | ICD-10-CM | POA: Diagnosis not present

## 2021-02-01 ENCOUNTER — Other Ambulatory Visit: Payer: 59

## 2021-02-01 DIAGNOSIS — M9905 Segmental and somatic dysfunction of pelvic region: Secondary | ICD-10-CM | POA: Diagnosis not present

## 2021-02-01 DIAGNOSIS — M9903 Segmental and somatic dysfunction of lumbar region: Secondary | ICD-10-CM | POA: Diagnosis not present

## 2021-02-01 DIAGNOSIS — M5136 Other intervertebral disc degeneration, lumbar region: Secondary | ICD-10-CM | POA: Diagnosis not present

## 2021-02-01 DIAGNOSIS — M25551 Pain in right hip: Secondary | ICD-10-CM | POA: Diagnosis not present

## 2021-02-02 DIAGNOSIS — M5136 Other intervertebral disc degeneration, lumbar region: Secondary | ICD-10-CM | POA: Diagnosis not present

## 2021-02-02 DIAGNOSIS — M9903 Segmental and somatic dysfunction of lumbar region: Secondary | ICD-10-CM | POA: Diagnosis not present

## 2021-02-02 DIAGNOSIS — M25551 Pain in right hip: Secondary | ICD-10-CM | POA: Diagnosis not present

## 2021-02-02 DIAGNOSIS — M9905 Segmental and somatic dysfunction of pelvic region: Secondary | ICD-10-CM | POA: Diagnosis not present

## 2021-02-03 ENCOUNTER — Other Ambulatory Visit: Payer: Self-pay | Admitting: Family Medicine

## 2021-02-03 MED ORDER — PREDNISONE 20 MG PO TABS: ORAL_TABLET | ORAL | 0 refills | Status: AC

## 2021-02-03 NOTE — Progress Notes (Signed)
The patient is on vacation but is having worsening pain.  We will start with low-dose prednisone at this time.

## 2021-02-08 ENCOUNTER — Other Ambulatory Visit: Payer: Self-pay

## 2021-02-08 DIAGNOSIS — M25551 Pain in right hip: Secondary | ICD-10-CM

## 2021-02-09 ENCOUNTER — Other Ambulatory Visit: Payer: Self-pay

## 2021-02-09 ENCOUNTER — Ambulatory Visit (INDEPENDENT_AMBULATORY_CARE_PROVIDER_SITE_OTHER)
Admission: RE | Admit: 2021-02-09 | Discharge: 2021-02-09 | Disposition: A | Discharge status: Home / Self Care | Payer: 59 | Source: Ambulatory Visit | Attending: Family Medicine | Admitting: Family Medicine

## 2021-02-09 DIAGNOSIS — M25552 Pain in left hip: Secondary | ICD-10-CM | POA: Diagnosis not present

## 2021-02-09 DIAGNOSIS — M47816 Spondylosis without myelopathy or radiculopathy, lumbar region: Secondary | ICD-10-CM | POA: Diagnosis not present

## 2021-02-09 DIAGNOSIS — M25551 Pain in right hip: Secondary | ICD-10-CM | POA: Diagnosis not present

## 2021-02-09 DIAGNOSIS — M9903 Segmental and somatic dysfunction of lumbar region: Secondary | ICD-10-CM | POA: Diagnosis not present

## 2021-02-09 DIAGNOSIS — M5136 Other intervertebral disc degeneration, lumbar region: Secondary | ICD-10-CM | POA: Diagnosis not present

## 2021-02-09 DIAGNOSIS — N200 Calculus of kidney: Secondary | ICD-10-CM | POA: Diagnosis not present

## 2021-02-09 DIAGNOSIS — M9905 Segmental and somatic dysfunction of pelvic region: Secondary | ICD-10-CM | POA: Diagnosis not present

## 2021-02-11 ENCOUNTER — Ambulatory Visit
Admission: RE | Admit: 2021-02-11 | Discharge: 2021-02-11 | Disposition: A | Discharge status: Home / Self Care | Payer: 59 | Source: Ambulatory Visit | Attending: Family Medicine | Admitting: Family Medicine

## 2021-02-11 ENCOUNTER — Encounter: Payer: Self-pay | Admitting: Family Medicine

## 2021-02-11 ENCOUNTER — Telehealth: Payer: Self-pay

## 2021-02-11 DIAGNOSIS — M5136 Other intervertebral disc degeneration, lumbar region: Secondary | ICD-10-CM | POA: Diagnosis not present

## 2021-02-11 DIAGNOSIS — M5126 Other intervertebral disc displacement, lumbar region: Secondary | ICD-10-CM | POA: Diagnosis not present

## 2021-02-11 DIAGNOSIS — M545 Low back pain, unspecified: Secondary | ICD-10-CM

## 2021-02-11 DIAGNOSIS — M4316 Spondylolisthesis, lumbar region: Secondary | ICD-10-CM | POA: Diagnosis not present

## 2021-02-11 MED ORDER — DIAZEPAM 5 MG PO TABS: 10.0000 mg | ORAL_TABLET | Freq: Once | ORAL | Status: DC

## 2021-02-11 MED ORDER — MEPERIDINE HCL 50 MG/ML IJ SOLN: 50.0000 mg | Freq: Once | INTRAMUSCULAR | Status: DC | PRN

## 2021-02-11 MED ORDER — ONDANSETRON HCL 4 MG/2ML IJ SOLN: 4.0000 mg | Freq: Once | INTRAMUSCULAR | Status: DC | PRN

## 2021-02-11 MED ORDER — IOPAMIDOL (ISOVUE-M 200) INJECTION 41%
20.0000 mL | Freq: Once | INTRAMUSCULAR | Status: AC
  Administered 2021-02-11: 20 mL via INTRATHECAL

## 2021-02-11 NOTE — Discharge Instructions (Signed)

## 2021-02-13 ENCOUNTER — Encounter: Payer: Self-pay | Admitting: Family Medicine

## 2021-02-14 DIAGNOSIS — M9905 Segmental and somatic dysfunction of pelvic region: Secondary | ICD-10-CM | POA: Diagnosis not present

## 2021-02-14 DIAGNOSIS — M9903 Segmental and somatic dysfunction of lumbar region: Secondary | ICD-10-CM | POA: Diagnosis not present

## 2021-02-14 DIAGNOSIS — M25551 Pain in right hip: Secondary | ICD-10-CM | POA: Diagnosis not present

## 2021-02-14 DIAGNOSIS — M5136 Other intervertebral disc degeneration, lumbar region: Secondary | ICD-10-CM | POA: Diagnosis not present

## 2021-02-15 ENCOUNTER — Other Ambulatory Visit: Payer: Self-pay

## 2021-02-15 DIAGNOSIS — M9903 Segmental and somatic dysfunction of lumbar region: Secondary | ICD-10-CM | POA: Diagnosis not present

## 2021-02-15 DIAGNOSIS — M25551 Pain in right hip: Secondary | ICD-10-CM | POA: Diagnosis not present

## 2021-02-15 DIAGNOSIS — M5136 Other intervertebral disc degeneration, lumbar region: Secondary | ICD-10-CM | POA: Diagnosis not present

## 2021-02-15 DIAGNOSIS — M5416 Radiculopathy, lumbar region: Secondary | ICD-10-CM

## 2021-02-15 DIAGNOSIS — M9905 Segmental and somatic dysfunction of pelvic region: Secondary | ICD-10-CM | POA: Diagnosis not present

## 2021-02-15 NOTE — Progress Notes (Signed)
Epidural ordered.  

## 2021-02-17 DIAGNOSIS — M9905 Segmental and somatic dysfunction of pelvic region: Secondary | ICD-10-CM | POA: Diagnosis not present

## 2021-02-17 DIAGNOSIS — M9903 Segmental and somatic dysfunction of lumbar region: Secondary | ICD-10-CM | POA: Diagnosis not present

## 2021-02-17 DIAGNOSIS — D225 Melanocytic nevi of trunk: Secondary | ICD-10-CM | POA: Diagnosis not present

## 2021-02-17 DIAGNOSIS — M25551 Pain in right hip: Secondary | ICD-10-CM | POA: Diagnosis not present

## 2021-02-17 DIAGNOSIS — L814 Other melanin hyperpigmentation: Secondary | ICD-10-CM | POA: Diagnosis not present

## 2021-02-17 DIAGNOSIS — Z23 Encounter for immunization: Secondary | ICD-10-CM | POA: Diagnosis not present

## 2021-02-17 DIAGNOSIS — L578 Other skin changes due to chronic exposure to nonionizing radiation: Secondary | ICD-10-CM | POA: Diagnosis not present

## 2021-02-17 DIAGNOSIS — L821 Other seborrheic keratosis: Secondary | ICD-10-CM | POA: Diagnosis not present

## 2021-02-17 DIAGNOSIS — L723 Sebaceous cyst: Secondary | ICD-10-CM | POA: Diagnosis not present

## 2021-02-17 DIAGNOSIS — Z808 Family history of malignant neoplasm of other organs or systems: Secondary | ICD-10-CM | POA: Diagnosis not present

## 2021-02-17 DIAGNOSIS — M5136 Other intervertebral disc degeneration, lumbar region: Secondary | ICD-10-CM | POA: Diagnosis not present

## 2021-02-17 NOTE — Progress Notes (Signed)
°  Cody [name] [address] [place] Phone: [phone] Subjective:   Cody [name], am serving as a scribe for Dr. Hulan [name]. This visit occurred during the SARS-CoV-2 public health emergency.  Safety protocols were in place, including screening questions prior to the visit, additional usage of staff PPE, and extensive cleaning of exam room while observing appropriate contact time as indicated for disinfecting solutions.   I'm seeing this patient by the request  of:  Martinique, [name], MD  CC: Low back pain follow-up  TDS:[id number]  Cody [name] is a 56 y.o. male coming in with complaint of back and neck pain. Patient states follow up about back.  Patient states he has not had any pain.  Seeing a chiropractor regularly.  Doing red laser therapy that he states is helping.  Patient is also going to stretch sessions.  Medications patient has been prescribed: None        Past Medical History:  Diagnosis Date   Claustrophobia    Complication of anesthesia    dizzy, difficult time waking up   Hemorrhoid 01/2019   external   Medical history non-contributory     Allergies  Allergen Reactions   Dust Mite Mixed Allergen Ext [Mite (D. Farinae)]    Penicillins     Has patient had a PCN reaction causing immediate rash, facial/tongue/throat swelling, SOB or lightheadedness with hypotension: No Has patient had a PCN reaction causing severe rash involving mucus membranes or skin necrosis: No Has patient had a PCN reaction that required hospitalization No Has patient had a PCN reaction occurring within the last 10 years: No If all of the above answers are "NO", then may proceed with Cephalosporin use.      Review of Systems:  No headache, visual changes, nausea, vomiting, diarrhea, constipation, dizziness, abdominal pain, skin rash, fevers, chills, night sweats, weight loss, swollen lymph nodes, body aches, joint swelling, chest pain,  shortness of breath, mood changes. POSITIVE muscle aches  Objective  Blood pressure 116/72, pulse 72, height 5\' 6"  (1.676 m), SpO2 97 %.   General: No apparent distress alert and oriented x3 mood and affect normal, dressed appropriately.  HEENT: Pupils equal, extraocular movements intact  Respiratory: Patient's speak in full sentences and does not appear short of breath  Cardiovascular: No lower extremity edema, non tender, no erythema  Patient is walking around the room comfortably.  Still has some tightness noted of the lower extremities when showing Cody [name].        Assessment and Plan:        The above documentation has been reviewed and is accurate and complete Cody Pulley, DO        Note: This dictation was prepared with Dragon dictation along with smaller phrase technology. Any transcriptional errors that result from this process are unintentional.

## 2021-02-18 ENCOUNTER — Other Ambulatory Visit: Payer: Self-pay

## 2021-02-18 ENCOUNTER — Ambulatory Visit: Payer: 59 | Admitting: Family Medicine

## 2021-02-18 DIAGNOSIS — M5441 Lumbago with sciatica, right side: Secondary | ICD-10-CM

## 2021-02-18 DIAGNOSIS — G8929 Other chronic pain: Secondary | ICD-10-CM

## 2021-02-18 NOTE — Assessment & Plan Note (Signed)
Patient is much better at this point with the lower back.  Seems to be responding to the conservative therapy now.  We do have the CT scan though showing the L4-L5 as well as the L5-S1 degenerative disc disease with nerve root impingement.  Discussed with patient that if any worsening symptoms of what medications to try.  I sent him the paperwork to her MyChart.  In addition of this discussed that it may be an epidural beneficial if this continues.  Follow-up with me again as needed

## 2021-02-18 NOTE — Patient Instructions (Signed)
Great to see you and I am glad you are better  If you get a flare Ibuprofen 600mg  3 times a day  Gabapentin 100-200mg  at night  Flexeril at least 1 time a day for 3 days If still in pain then call me and we need to consider the epidural  Happy New Year!

## 2021-02-21 DIAGNOSIS — M5136 Other intervertebral disc degeneration, lumbar region: Secondary | ICD-10-CM | POA: Diagnosis not present

## 2021-02-21 DIAGNOSIS — M25551 Pain in right hip: Secondary | ICD-10-CM | POA: Diagnosis not present

## 2021-02-21 DIAGNOSIS — M9905 Segmental and somatic dysfunction of pelvic region: Secondary | ICD-10-CM | POA: Diagnosis not present

## 2021-02-21 DIAGNOSIS — M9903 Segmental and somatic dysfunction of lumbar region: Secondary | ICD-10-CM | POA: Diagnosis not present

## 2021-02-23 ENCOUNTER — Ambulatory Visit: Payer: 59 | Admitting: Family Medicine

## 2021-02-23 DIAGNOSIS — M25551 Pain in right hip: Secondary | ICD-10-CM | POA: Diagnosis not present

## 2021-02-23 DIAGNOSIS — M9903 Segmental and somatic dysfunction of lumbar region: Secondary | ICD-10-CM | POA: Diagnosis not present

## 2021-02-23 DIAGNOSIS — M5136 Other intervertebral disc degeneration, lumbar region: Secondary | ICD-10-CM | POA: Diagnosis not present

## 2021-02-23 DIAGNOSIS — M9905 Segmental and somatic dysfunction of pelvic region: Secondary | ICD-10-CM | POA: Diagnosis not present

## 2021-02-24 ENCOUNTER — Other Ambulatory Visit: Payer: 59

## 2021-05-17 ENCOUNTER — Ambulatory Visit: Payer: 59 | Admitting: Family Medicine

## 2021-05-24 ENCOUNTER — Ambulatory Visit: Payer: 59 | Admitting: Family Medicine

## 2021-06-27 ENCOUNTER — Ambulatory Visit: Payer: 59 | Admitting: Family Medicine

## 2021-07-12 ENCOUNTER — Ambulatory Visit: Payer: 59 | Admitting: Family Medicine

## 2021-07-18 ENCOUNTER — Other Ambulatory Visit: Payer: Self-pay | Admitting: Family Medicine

## 2021-07-18 ENCOUNTER — Other Ambulatory Visit: Payer: Self-pay

## 2021-07-18 ENCOUNTER — Other Ambulatory Visit (HOSPITAL_COMMUNITY): Payer: Self-pay

## 2021-07-18 DIAGNOSIS — G8929 Other chronic pain: Secondary | ICD-10-CM

## 2021-07-18 DIAGNOSIS — M5416 Radiculopathy, lumbar region: Secondary | ICD-10-CM

## 2021-07-18 MED ORDER — PREDNISONE 50 MG PO TABS
50.0000 mg | ORAL_TABLET | Freq: Every day | ORAL | 0 refills | Status: DC
  Filled 2021-07-18: qty 5, 5d supply, fill #0

## 2021-07-18 NOTE — Progress Notes (Unsigned)
Flare of the lower back Send in prednisone and recommend epidural L5-S1 and then see me 4 weeks later

## 2021-07-18 NOTE — Progress Notes (Signed)
Epidural ordered.  

## 2021-07-21 DIAGNOSIS — M25551 Pain in right hip: Secondary | ICD-10-CM | POA: Diagnosis not present

## 2021-07-21 DIAGNOSIS — M9903 Segmental and somatic dysfunction of lumbar region: Secondary | ICD-10-CM | POA: Diagnosis not present

## 2021-07-21 DIAGNOSIS — M9905 Segmental and somatic dysfunction of pelvic region: Secondary | ICD-10-CM | POA: Diagnosis not present

## 2021-07-21 DIAGNOSIS — M5136 Other intervertebral disc degeneration, lumbar region: Secondary | ICD-10-CM | POA: Diagnosis not present

## 2021-07-22 ENCOUNTER — Ambulatory Visit
Admission: RE | Admit: 2021-07-22 | Discharge: 2021-07-22 | Disposition: A | Discharge status: Home / Self Care | Payer: 59 | Source: Ambulatory Visit | Attending: Family Medicine | Admitting: Family Medicine

## 2021-07-22 ENCOUNTER — Other Ambulatory Visit: Payer: Self-pay | Admitting: Family Medicine

## 2021-07-22 DIAGNOSIS — M5416 Radiculopathy, lumbar region: Secondary | ICD-10-CM

## 2021-07-22 DIAGNOSIS — M47817 Spondylosis without myelopathy or radiculopathy, lumbosacral region: Secondary | ICD-10-CM | POA: Diagnosis not present

## 2021-07-22 MED ORDER — METHYLPREDNISOLONE ACETATE 40 MG/ML INJ SUSP (RADIOLOG
80.0000 mg | Freq: Once | INTRAMUSCULAR | Status: AC
  Administered 2021-07-22: 80 mg via EPIDURAL

## 2021-07-22 MED ORDER — IOPAMIDOL (ISOVUE-M 200) INJECTION 41%
1.0000 mL | Freq: Once | INTRAMUSCULAR | Status: AC
  Administered 2021-07-22: 1 mL via EPIDURAL

## 2021-07-22 NOTE — Discharge Instructions (Signed)

## 2021-07-25 ENCOUNTER — Other Ambulatory Visit (HOSPITAL_COMMUNITY): Payer: Self-pay

## 2021-07-25 ENCOUNTER — Other Ambulatory Visit: Payer: Self-pay | Admitting: Family Medicine

## 2021-07-25 MED ORDER — GABAPENTIN 100 MG PO CAPS
200.0000 mg | ORAL_CAPSULE | Freq: Every day | ORAL | 3 refills | Status: DC
  Filled 2021-07-25: qty 60, 30d supply, fill #0
  Filled 2021-12-06: qty 60, 30d supply, fill #1
  Filled 2022-02-27: qty 60, 30d supply, fill #2
  Filled 2022-04-21: qty 60, 30d supply, fill #3

## 2021-07-25 MED ORDER — CYCLOBENZAPRINE HCL 5 MG PO TABS
5.0000 mg | ORAL_TABLET | Freq: Three times a day (TID) | ORAL | 1 refills | Status: DC | PRN
  Filled 2021-07-25: qty 90, 30d supply, fill #0

## 2021-07-25 NOTE — Progress Notes (Signed)
Refill meds and r-order epidural

## 2021-07-26 ENCOUNTER — Other Ambulatory Visit (HOSPITAL_COMMUNITY): Payer: Self-pay

## 2021-08-01 ENCOUNTER — Other Ambulatory Visit (HOSPITAL_COMMUNITY): Payer: Self-pay

## 2021-08-01 DIAGNOSIS — M5136 Other intervertebral disc degeneration, lumbar region: Secondary | ICD-10-CM | POA: Diagnosis not present

## 2021-08-01 MED ORDER — METHYLPREDNISOLONE 4 MG PO TBPK
ORAL_TABLET | ORAL | 0 refills | Status: DC
  Filled 2021-08-01: qty 21, 6d supply, fill #0

## 2021-08-02 NOTE — Progress Notes (Signed)
[place] Phone: [phone] Subjective:   [name], am serving as a scribe for Dr. Hulan Saas.  I'm seeing this patient by the request  of:  Martinique, [name], MD  CC: low back pain follow up   [id number]  02/18/2021 Patient is much better at this point with the lower back.  Seems to be responding to the conservative therapy now.  We do have the CT scan though showing the L4-L5 as well as the L5-S1 degenerative disc disease with nerve root impingement.  Discussed with patient that if any worsening symptoms of what medications to try.  I sent him the paperwork to her MyChart.  In addition of this discussed that it may be an epidural beneficial if this continues.  Follow-up with me again as needed  Update 08/04/2021 Cody [name] is a 56 y.o. male coming in with complaint of low back pain. Epidural scheduled 08/09/2021. Patient states that 4 weeks ago his back pain returned. Epidural last week helped his pain somewhat. Did speak with surgeon who things he has facet joint arthropathy. Pain is occurring more frequently which concerns patient. Patient notes feeling of restless legs once his back pain improved back in January.       Past Medical History:  Diagnosis Date   Claustrophobia    Complication of anesthesia    dizzy, difficult time waking up   Hemorrhoid 01/2019   external   Medical history non-contributory    Past Surgical History:  Procedure Laterality Date   HERNIA REPAIR     umbicial   LAPAROSCOPIC CHOLECYSTECTOMY     NECK SURGERY     as a child- patient is not sure what itwas- had cat scratch fever   RADIOLOGY WITH ANESTHESIA N/A 01/13/2016   Procedure: MRI of brain with and without and cervical spine and lumbar spine;  Surgeon: Medication Radiologist, MD;  Location: [facility];  Service: Radiology;  Laterality: N/A;   RADIOLOGY WITH ANESTHESIA N/A 01/21/2019   Procedure: MRI WITH ANESTHESIA  CERVICAL SPINE WITHOUT IV CONTRAST;  Surgeon: Radiologist, Medication, MD;  Location: [facility];  Service: Radiology;  Laterality: N/A;   Social History   Socioeconomic History   Marital status: Married    Spouse name: Not on file   Number of children: Not on file   Years of education: Not on file   Highest education level: Not on file  Occupational History   Not on file  Tobacco Use   Smoking status: Never   Smokeless tobacco: Never  Vaping Use   Vaping Use: Never used  Substance and Sexual Activity   Alcohol use: Yes    Comment: occassional   Drug use: No   Sexual activity: Yes  Other Topics Concern   Not on file  Social History Narrative   Not on file   Social Determinants of Health   Financial Resource Strain: Not on file  Food Insecurity: Not on file  Transportation Needs: Not on file  Physical Activity: Not on file  Stress: Not on file  Social Connections: Not on file   Allergies  Allergen Reactions   Dust Mite Mixed Allergen Ext [Mite (D. Farinae)]    Penicillins     Has patient had a PCN reaction causing immediate rash, facial/tongue/throat swelling, SOB or lightheadedness with hypotension: No Has patient had a PCN reaction causing severe rash involving mucus membranes or skin necrosis: No Has patient had a PCN  reaction that required hospitalization No Has patient had a PCN reaction occurring within the last 10 years: No If all of the above answers are "NO", then may proceed with Cephalosporin use.    Family History  Adopted: Yes  Problem Relation Age of Onset   Colon polyps Father    Cancer Paternal Uncle        colon at 65.   Colon cancer Paternal Uncle    Esophageal cancer Neg Hx    Rectal cancer Neg Hx    Stomach cancer Neg Hx     Current Outpatient Medications (Endocrine & Metabolic):    methylPREDNISolone (MEDROL) 4 MG TBPK tablet, Take as directed on package instructions   predniSONE (DELTASONE) 50 MG tablet, Take 1 tablet by mouth  daily.      Current Outpatient Medications (Other):    cyclobenzaprine (FLEXERIL) 5 MG tablet, Take 1 tablet (5 mg total) by mouth 3 (three) times daily as needed for muscle spasms.   gabapentin (NEURONTIN) 100 MG capsule, Take 2 capsules (200 mg total) by mouth at bedtime.   omeprazole (PRILOSEC) 10 MG capsule, Take 10 mg by mouth daily.    Review of Systems:  No headache, visual changes, nausea, vomiting, diarrhea, constipation, dizziness, abdominal pain, skin rash, fevers, chills, night sweats, weight loss, swollen lymph nodes, body aches, joint swelling, chest pain, shortness of breath, mood changes. POSITIVE muscle aches  Objective  Blood pressure 118/82, pulse 84, height '5\' 6"'$  (1.676 m), weight 205 lb (93 kg), SpO2 98 %.   General: No apparent distress but anxious HEENT: Pupils equal, extraocular movements intact  Respiratory: Patient's speak in full sentences and does not appear short of breath  Cardiovascular: No lower extremity edema, non tender, no erythema  Patient back exam does have some loss of lordosis.  Patient lacks last 5 degrees of extension.  Patient will seem to have more pain though with flexion.  Patient does have more discomfort with translation to the left.  Tightness with straight leg test left greater than right patient is able to get out of the chair without any significant difficulty.  No fasciculations noted today.  Neurovascularly intact.    Impression and Recommendations:     The above documentation has been reviewed and is accurate and complete [name], DO

## 2021-08-04 ENCOUNTER — Ambulatory Visit: Payer: 59 | Admitting: Family Medicine

## 2021-08-04 VITALS — BP 118/82 | HR 84 | Ht 66.0 in | Wt 205.0 lb

## 2021-08-04 DIAGNOSIS — M5441 Lumbago with sciatica, right side: Secondary | ICD-10-CM

## 2021-08-04 DIAGNOSIS — G8929 Other chronic pain: Secondary | ICD-10-CM | POA: Diagnosis not present

## 2021-08-04 DIAGNOSIS — M2550 Pain in unspecified joint: Secondary | ICD-10-CM | POA: Diagnosis not present

## 2021-08-04 NOTE — Assessment & Plan Note (Signed)
Patient continues to have low back pain.  Did see a orthopedic surgeon with back specialty and concern for potential facet arthritic changes causing some of the pain as well.  Patient is going to be scheduled for an MRI as well as facet injections from them.  I think this would give [place] more information and help [place] diagnostically so we can hopefully figure out the best response for her.  Joint.  Due to the severity of pain and patient discussing the fasciculation of the legs we will get laboratory work-up as well to make sure nothing else is potentially contributing.  We did discuss different medications that I think would be beneficial for some of the radicular pain, musculoskeletal pain as well as the anxiety that is provoked from the pain such as Effexor which patient declined immediately.  Patient will follow-up with me again to discuss after labs

## 2021-08-05 ENCOUNTER — Other Ambulatory Visit (HOSPITAL_COMMUNITY): Payer: Self-pay | Admitting: Orthopedic Surgery

## 2021-08-05 ENCOUNTER — Other Ambulatory Visit: Payer: Self-pay | Admitting: Orthopedic Surgery

## 2021-08-05 DIAGNOSIS — M545 Low back pain, unspecified: Secondary | ICD-10-CM

## 2021-08-09 ENCOUNTER — Ambulatory Visit
Admission: RE | Admit: 2021-08-09 | Discharge: 2021-08-09 | Disposition: A | Discharge status: Home / Self Care | Payer: 59 | Source: Ambulatory Visit | Attending: Family Medicine | Admitting: Family Medicine

## 2021-08-09 DIAGNOSIS — M5416 Radiculopathy, lumbar region: Secondary | ICD-10-CM

## 2021-08-09 DIAGNOSIS — M47817 Spondylosis without myelopathy or radiculopathy, lumbosacral region: Secondary | ICD-10-CM | POA: Diagnosis not present

## 2021-08-09 MED ORDER — METHYLPREDNISOLONE ACETATE 40 MG/ML INJ SUSP (RADIOLOG
80.0000 mg | Freq: Once | INTRAMUSCULAR | Status: AC
  Administered 2021-08-09: 80 mg via EPIDURAL

## 2021-08-09 MED ORDER — IOPAMIDOL (ISOVUE-M 200) INJECTION 41%
1.0000 mL | Freq: Once | INTRAMUSCULAR | Status: AC
  Administered 2021-08-09: 1 mL via EPIDURAL

## 2021-08-10 ENCOUNTER — Ambulatory Visit: Payer: 59 | Admitting: Family Medicine

## 2021-09-13 ENCOUNTER — Other Ambulatory Visit (INDEPENDENT_AMBULATORY_CARE_PROVIDER_SITE_OTHER): Payer: 59

## 2021-09-13 DIAGNOSIS — M2550 Pain in unspecified joint: Secondary | ICD-10-CM | POA: Diagnosis not present

## 2021-09-13 LAB — CBC WITH DIFFERENTIAL/PLATELET
Basophils Absolute: 0 10*3/uL (ref 0.0–0.1)
Basophils Relative: 0.5 % (ref 0.0–3.0)
Eosinophils Absolute: 0.1 10*3/uL (ref 0.0–0.7)
Eosinophils Relative: 2.6 % (ref 0.0–5.0)
HCT: 41.6 % (ref 39.0–52.0)
Hemoglobin: 14.4 g/dL (ref 13.0–17.0)
Lymphocytes Relative: 37.3 % (ref 12.0–46.0)
Lymphs Abs: 2 10*3/uL (ref 0.7–4.0)
MCHC: 34.6 g/dL (ref 30.0–36.0)
MCV: 88.6 fl (ref 78.0–100.0)
Monocytes Absolute: 0.3 10*3/uL (ref 0.1–1.0)
Monocytes Relative: 5.9 % (ref 3.0–12.0)
Neutro Abs: 2.9 10*3/uL (ref 1.4–7.7)
Neutrophils Relative %: 53.7 % (ref 43.0–77.0)
Platelets: 216 10*3/uL (ref 150.0–400.0)
RBC: 4.69 Mil/uL (ref 4.22–5.81)
RDW: 13 % (ref 11.5–15.5)
WBC: 5.4 10*3/uL (ref 4.0–10.5)

## 2021-09-13 LAB — COMPREHENSIVE METABOLIC PANEL
ALT: 32 U/L (ref 0–53)
AST: 19 U/L (ref 0–37)
Albumin: 4.5 g/dL (ref 3.5–5.2)
Alkaline Phosphatase: 55 U/L (ref 39–117)
BUN: 21 mg/dL (ref 6–23)
CO2: 27 mEq/L (ref 19–32)
Calcium: 9.6 mg/dL (ref 8.4–10.5)
Chloride: 103 mEq/L (ref 96–112)
Creatinine, Ser: 1.04 mg/dL (ref 0.40–1.50)
GFR: 81.63 mL/min (ref >60.00)
Glucose, Bld: 97 mg/dL (ref 70–99)
Potassium: 4.2 mEq/L (ref 3.5–5.1)
Sodium: 139 mEq/L (ref 135–145)
Total Bilirubin: 0.8 mg/dL (ref 0.2–1.2)
Total Protein: 7.7 g/dL (ref 6.0–8.3)

## 2021-09-13 LAB — IBC PANEL
Iron: 116 ug/dL (ref 42–165)
Saturation Ratios: 24.7 % (ref 20.0–50.0)
TIBC: 470.4 ug/dL — ABNORMAL HIGH (ref 250.0–450.0)
Transferrin: 336 mg/dL (ref 212.0–360.0)

## 2021-09-13 LAB — URIC ACID: Uric Acid, Serum: 7.1 mg/dL (ref 4.0–7.8)

## 2021-09-13 LAB — SEDIMENTATION RATE: Sed Rate: 17 mm/hr (ref 0–20)

## 2021-09-13 LAB — C-REACTIVE PROTEIN: CRP: <1 mg/dL (ref 0.5–20.0)

## 2021-09-13 LAB — FERRITIN: Ferritin: 234.6 ng/mL (ref 22.0–322.0)

## 2021-09-13 LAB — TSH: TSH: 1.92 u[IU]/mL (ref 0.35–5.50)

## 2021-09-13 LAB — VITAMIN D 25 HYDROXY (VIT D DEFICIENCY, FRACTURES): VITD: 21.01 ng/mL — ABNORMAL LOW (ref 30.00–100.00)

## 2021-09-13 LAB — TESTOSTERONE: Testosterone: 262.66 ng/dL — ABNORMAL LOW (ref 300.00–890.00)

## 2021-09-14 ENCOUNTER — Other Ambulatory Visit: Payer: Self-pay | Admitting: Family Medicine

## 2021-09-14 ENCOUNTER — Other Ambulatory Visit (HOSPITAL_COMMUNITY): Payer: Self-pay

## 2021-09-14 MED ORDER — VITAMIN D (ERGOCALCIFEROL) 1.25 MG (50000 UNIT) PO CAPS
50000.0000 [IU] | ORAL_CAPSULE | ORAL | 0 refills | Status: DC
  Filled 2021-09-14: qty 12, 84d supply, fill #0

## 2021-09-15 LAB — ANTI-NUCLEAR AB-TITER (ANA TITER): ANA Titer 1: 1:40 {titer} — ABNORMAL HIGH

## 2021-09-15 LAB — CALCIUM, IONIZED: Calcium, Ion: 5 mg/dL (ref 4.7–5.5)

## 2021-09-15 LAB — ANA: Anti Nuclear Antibody (ANA): POSITIVE — AB

## 2021-09-15 LAB — ANGIOTENSIN CONVERTING ENZYME: Angiotensin-Converting Enzyme: 18 U/L (ref 9–67)

## 2021-09-15 LAB — CYCLIC CITRUL PEPTIDE ANTIBODY, IGG: Cyclic Citrullin Peptide Ab: <16 UNITS

## 2021-09-15 LAB — PTH, INTACT AND CALCIUM
Calcium: 9.3 mg/dL (ref 8.6–10.3)
PTH: 32 pg/mL (ref 16–77)

## 2021-09-15 LAB — RHEUMATOID FACTOR: Rheumatoid fact SerPl-aCnc: <14 IU/mL (ref <14)

## 2021-09-16 NOTE — Progress Notes (Signed)
Patient has scheduled MRI for Tuesday, 09/20/21 at 08:00 o'clock. H&P on Media from 09/09/21 doesn't have recent VS. MRI staff was notified that patient must have H&P form completed with all information.

## 2021-09-19 ENCOUNTER — Encounter (HOSPITAL_COMMUNITY): Payer: Self-pay | Admitting: General Practice

## 2021-09-19 NOTE — Progress Notes (Addendum)
SDW CALL  Patient was given pre-op instructions over the phone. The opportunity was given for the patient to ask questions. No further questions asked. Patient verbalized understanding of instructions given.   PCP - Dr. Betty Martinique Cardiologist - denies  PPM/ICD - denies   Chest x-ray - 11/29/20 EKG - denies Stress Test - denies ECHO - denies Cardiac Cath - denies  Sleep Study - denies    Blood Thinner Instructions: n/a    COVID TEST- n/a   Anesthesia review: no  Patient denies shortness of breath, fever, cough and chest pain over the phone call   All instructions explained to the patient, with a verbal understanding of the material. Patient agrees to go over the instructions while at home for a better understanding.    Patient states that his wife will be bringing him the morning of the MRI but will not be able to pick him up until 12.  Patient states that she will take him home and will remain with him for the first 24 hours after the MRI.  PACU, Jake Bathe, aware that patient will not have a ride home until 1200.

## 2021-09-19 NOTE — Anesthesia Preprocedure Evaluation (Signed)
Anesthesia Evaluation  Patient identified by MRN, date of birth, ID band Patient awake    Reviewed: Allergy & Precautions, NPO status , Patient's Chart, lab work & pertinent test results  History of Anesthesia Complications Negative for: history of anesthetic complications  Airway Mallampati: II  TM Distance: >3 FB Neck ROM: Full    Dental no notable dental hx. (+) Missing,    Pulmonary neg pulmonary ROS,    Pulmonary exam normal breath sounds clear to auscultation       Cardiovascular negative cardio ROS Normal cardiovascular exam Rhythm:Regular Rate:Normal     Neuro/Psych Anxiety Neck pain    GI/Hepatic negative GI ROS, Neg liver ROS,   Endo/Other  Morbid obesity  Renal/GU negative Renal ROS  negative genitourinary   Musculoskeletal negative musculoskeletal ROS (+)   Abdominal   Peds  Hematology negative hematology ROS (+)   Anesthesia Other Findings Day of surgery medications reviewed with patient.  Reproductive/Obstetrics negative OB ROS                            Anesthesia Physical  Anesthesia Plan  ASA: 2  Anesthesia Plan: General   Post-op Pain Management: Minimal or no pain anticipated   Induction: Intravenous  PONV Risk Score and Plan: 2 and Treatment may vary due to age or medical condition, Ondansetron, Dexamethasone and Midazolam  Airway Management Planned: Oral ETT and LMA  Additional Equipment: None  Intra-op Plan:   Post-operative Plan: Extubation in OR  Informed Consent: I have reviewed the patients History and Physical, chart, labs and discussed the procedure including the risks, benefits and alternatives for the proposed anesthesia with the patient or authorized representative who has indicated his/her understanding and acceptance.     Dental advisory given  Plan Discussed with: CRNA and Anesthesiologist  Anesthesia Plan Comments:          Anesthesia Quick Evaluation

## 2021-09-20 ENCOUNTER — Ambulatory Visit (HOSPITAL_COMMUNITY)
Admission: RE | Admit: 2021-09-20 | Discharge: 2021-09-20 | Disposition: A | Discharge status: Home / Self Care | Payer: 59 | Source: Ambulatory Visit | Attending: Orthopedic Surgery | Admitting: Orthopedic Surgery

## 2021-09-20 ENCOUNTER — Encounter (HOSPITAL_COMMUNITY)
Admission: RE | Disposition: A | Discharge status: Home / Self Care | Payer: Self-pay | Source: Ambulatory Visit | Attending: Orthopedic Surgery

## 2021-09-20 ENCOUNTER — Encounter (HOSPITAL_COMMUNITY): Payer: Self-pay | Admitting: Orthopedic Surgery

## 2021-09-20 ENCOUNTER — Other Ambulatory Visit: Payer: Self-pay

## 2021-09-20 ENCOUNTER — Ambulatory Visit (HOSPITAL_COMMUNITY): Payer: 59 | Admitting: Anesthesiology

## 2021-09-20 ENCOUNTER — Ambulatory Visit (HOSPITAL_BASED_OUTPATIENT_CLINIC_OR_DEPARTMENT_OTHER): Payer: 59 | Admitting: Anesthesiology

## 2021-09-20 DIAGNOSIS — M545 Low back pain, unspecified: Secondary | ICD-10-CM | POA: Diagnosis not present

## 2021-09-20 DIAGNOSIS — M5136 Other intervertebral disc degeneration, lumbar region: Secondary | ICD-10-CM | POA: Insufficient documentation

## 2021-09-20 DIAGNOSIS — M5137 Other intervertebral disc degeneration, lumbosacral region: Secondary | ICD-10-CM | POA: Diagnosis not present

## 2021-09-20 DIAGNOSIS — M5126 Other intervertebral disc displacement, lumbar region: Secondary | ICD-10-CM | POA: Diagnosis not present

## 2021-09-20 HISTORY — PX: RADIOLOGY WITH ANESTHESIA: SHX6223

## 2021-09-20 SURGERY — MRI WITH ANESTHESIA
Anesthesia: General

## 2021-09-20 MED ORDER — CHLORHEXIDINE GLUCONATE 0.12 % MT SOLN
15.0000 mL | Freq: Once | OROMUCOSAL | Status: AC
  Administered 2021-09-20: 15 mL via OROMUCOSAL

## 2021-09-20 MED ORDER — ONDANSETRON HCL 4 MG/2ML IJ SOLN
INTRAMUSCULAR | Status: DC | PRN
  Administered 2021-09-20: 4 mg via INTRAVENOUS

## 2021-09-20 MED ORDER — DEXAMETHASONE SODIUM PHOSPHATE 10 MG/ML IJ SOLN
INTRAMUSCULAR | Status: DC | PRN
  Administered 2021-09-20: 5 mg via INTRAVENOUS

## 2021-09-20 MED ORDER — ACETAMINOPHEN 325 MG PO TABS
325.0000 mg | ORAL_TABLET | ORAL | Status: DC | PRN
  Administered 2021-09-20: 650 mg via ORAL

## 2021-09-20 MED ORDER — OXYCODONE HCL 5 MG/5ML PO SOLN: 5.0000 mg | Freq: Once | ORAL | Status: DC | PRN

## 2021-09-20 MED ORDER — MIDAZOLAM HCL 2 MG/2ML IJ SOLN
INTRAMUSCULAR | Status: DC | PRN
  Administered 2021-09-20: 2 mg via INTRAVENOUS

## 2021-09-20 MED ORDER — MEPERIDINE HCL 25 MG/ML IJ SOLN: 6.2500 mg | INTRAMUSCULAR | Status: DC | PRN

## 2021-09-20 MED ORDER — FENTANYL CITRATE (PF) 100 MCG/2ML IJ SOLN: 25.0000 ug | INTRAMUSCULAR | Status: DC | PRN

## 2021-09-20 MED ORDER — LACTATED RINGERS IV SOLN
INTRAVENOUS | Status: DC
Start: 2021-09-20 — End: 2021-09-20

## 2021-09-20 MED ORDER — ORAL CARE MOUTH RINSE: 15.0000 mL | Freq: Once | OROMUCOSAL | Status: AC

## 2021-09-20 MED ORDER — ACETAMINOPHEN 325 MG PO TABS
ORAL_TABLET | ORAL | Status: AC
  Filled 2021-09-20: qty 2

## 2021-09-20 MED ORDER — ONDANSETRON HCL 4 MG/2ML IJ SOLN: 4.0000 mg | Freq: Once | INTRAMUSCULAR | Status: DC | PRN

## 2021-09-20 MED ORDER — SCOPOLAMINE 1 MG/3DAYS TD PT72
1.0000 | MEDICATED_PATCH | TRANSDERMAL | Status: DC
  Administered 2021-09-20: 1.5 mg via TRANSDERMAL
  Filled 2021-09-20: qty 1

## 2021-09-20 MED ORDER — LIDOCAINE 2% (20 MG/ML) 5 ML SYRINGE
INTRAMUSCULAR | Status: DC | PRN
  Administered 2021-09-20: 80 mg via INTRAVENOUS

## 2021-09-20 MED ORDER — CHLORHEXIDINE GLUCONATE 0.12 % MT SOLN
OROMUCOSAL | Status: AC
  Filled 2021-09-20: qty 15

## 2021-09-20 MED ORDER — PHENYLEPHRINE HCL-NACL 20-0.9 MG/250ML-% IV SOLN
INTRAVENOUS | Status: DC | PRN
  Administered 2021-09-20: 20 ug/min via INTRAVENOUS

## 2021-09-20 MED ORDER — ACETAMINOPHEN 160 MG/5ML PO SOLN: 325.0000 mg | ORAL | Status: DC | PRN

## 2021-09-20 MED ORDER — OXYCODONE HCL 5 MG PO TABS: 5.0000 mg | ORAL_TABLET | Freq: Once | ORAL | Status: DC | PRN

## 2021-09-20 MED ORDER — PROPOFOL 10 MG/ML IV BOLUS
INTRAVENOUS | Status: DC | PRN
  Administered 2021-09-20: 200 mg via INTRAVENOUS

## 2021-09-20 NOTE — Anesthesia Postprocedure Evaluation (Signed)
Anesthesia Post Note  Patient: Cody [name]  Procedure(s) Performed: MRI WITH ANESTHESIA LUMBAR SPINE WITHOUT CONTRAST     Patient location during evaluation: PACU Anesthesia Type: General Level of consciousness: awake and alert Pain management: pain level controlled Vital Signs Assessment: post-procedure vital signs reviewed and stable Respiratory status: spontaneous breathing, nonlabored ventilation, respiratory function stable and patient connected to nasal cannula oxygen Cardiovascular status: blood pressure returned to baseline and stable Postop Assessment: no apparent nausea or vomiting Anesthetic complications: no   No notable events documented.  Last Vitals:  Vitals:   09/20/21 0915 09/20/21 0923  BP: 119/76 111/67  Pulse: 67 67  Resp: 18 18  Temp:  36.8 C  SpO2: 92% 94%    Last Pain:  Vitals:   09/20/21 0923  TempSrc:   PainSc: 0-No pain                 [name]

## 2021-09-20 NOTE — Anesthesia Procedure Notes (Signed)
Procedure Name: LMA Insertion Date/Time: 09/20/2021 8:20 AM  Performed by: Imagene Riches, CRNAPre-anesthesia Checklist: Patient identified, Emergency Drugs available, Suction available and Patient being monitored Patient Re-evaluated:Patient Re-evaluated prior to induction Oxygen Delivery Method: Circle System Utilized Preoxygenation: Pre-oxygenation with 100% oxygen Induction Type: IV induction Ventilation: Mask ventilation without difficulty LMA: LMA inserted LMA Size: 4.0 Number of attempts: 1 Airway Equipment and Method: Bite block Placement Confirmation: positive ETCO2 Tube secured with: Tape Dental Injury: Teeth and Oropharynx as per pre-operative assessment

## 2021-09-20 NOTE — Transfer of Care (Signed)
Immediate Anesthesia Transfer of Care Note  Patient: ARMAS MCBEE  Procedure(s) Performed: MRI WITH ANESTHESIA LUMBAR SPINE WITHOUT CONTRAST  Patient Location: PACU  Anesthesia Type:General  Level of Consciousness: drowsy  Airway & Oxygen Therapy: Patient Spontanous Breathing and Patient connected to nasal cannula oxygen  Post-op Assessment: Report given to RN and Post -op Vital signs reviewed and stable  Post vital signs: Reviewed and stable  Last Vitals:  Vitals Value Taken Time  BP 122/84 09/20/21 0900  Temp    Pulse 70 09/20/21 0901  Resp 13 09/20/21 0901  SpO2 98 % 09/20/21 0901  Vitals shown include unvalidated device data.  Last Pain:  Vitals:   09/20/21 0701  TempSrc:   PainSc: 0-No pain         Complications: No notable events documented.

## 2021-09-21 ENCOUNTER — Encounter (HOSPITAL_COMMUNITY): Payer: Self-pay | Admitting: Radiology

## 2021-09-22 ENCOUNTER — Other Ambulatory Visit: Payer: Self-pay | Admitting: Orthopedic Surgery

## 2021-09-22 DIAGNOSIS — M545 Low back pain, unspecified: Secondary | ICD-10-CM

## 2021-10-27 ENCOUNTER — Other Ambulatory Visit: Payer: 59

## 2021-11-03 NOTE — Progress Notes (Signed)
[name] Phone: [phone] Subjective:   [name], am serving as a scribe for Dr. Hulan Saas.  I'm seeing this patient by the request  of:  [name], Cody Peng, FNP  CC: Right elbow and low testosterone follow-up  [id number]  Cody [name] is a 56 y.o. male coming in with complaint of R elbow pain. Seen for lateral epi on 2022. Patient states that 2 months ago he woke up with pain over lateral epicondyle. Does not recall an injury and is now in constant pain.  Patient states continues to have significant fatigue.  Has been doing the vitamin D.  Has not been doing the DHEA as much.     Past Medical History:  Diagnosis Date   Claustrophobia    Complication of anesthesia    dizzy, difficult time waking up   Hemorrhoid 01/2019   external   Medical history non-contributory    Past Surgical History:  Procedure Laterality Date   HERNIA REPAIR     umbicial   LAPAROSCOPIC CHOLECYSTECTOMY     NECK SURGERY     as a child- patient is not sure what itwas- had cat scratch fever   RADIOLOGY WITH ANESTHESIA N/A 01/13/2016   Procedure: MRI of brain with and without and cervical spine and lumbar spine;  Surgeon: Medication Radiologist, MD;  Location: [name];  Service: Radiology;  Laterality: N/A;   RADIOLOGY WITH ANESTHESIA N/A 01/21/2019   Procedure: MRI WITH ANESTHESIA CERVICAL SPINE WITHOUT IV CONTRAST;  Surgeon: Radiologist, Medication, MD;  Location: [name];  Service: Radiology;  Laterality: N/A;   RADIOLOGY WITH ANESTHESIA N/A 09/20/2021   Procedure: MRI WITH ANESTHESIA LUMBAR SPINE WITHOUT CONTRAST;  Surgeon: Radiologist, Medication, MD;  Location: [name];  Service: Radiology;  Laterality: N/A;   Social History   Socioeconomic History   Marital status: Married    Spouse name: Not on file   Number of children: Not on file   Years of education: Not on file   Highest education level: Not on file   Occupational History   Not on file  Tobacco Use   Smoking status: Never   Smokeless tobacco: Never  Vaping Use   Vaping Use: Never used  Substance and Sexual Activity   Alcohol use: Yes    Comment: occassional   Drug use: No   Sexual activity: Yes  Other Topics Concern   Not on file  Social History Narrative   Not on file   Social Determinants of Health   Financial Resource Strain: Not on file  Food Insecurity: Not on file  Transportation Needs: Not on file  Physical Activity: Not on file  Stress: Not on file  Social Connections: Not on file   No Known Allergies Family History  Adopted: Yes  Problem Relation Age of Onset   Colon polyps Father    Cancer Paternal Uncle        colon at 72.   Colon cancer Paternal Uncle    Esophageal cancer Neg Hx    Rectal cancer Neg Hx    Stomach cancer Neg Hx          Current Outpatient Medications (Other):    cyclobenzaprine (FLEXERIL) 5 MG tablet, Take 1 tablet (5 mg total) by mouth 3 (three) times daily as needed for muscle spasms.   gabapentin (NEURONTIN) 100 MG capsule, Take 2 capsules (200 mg total) by mouth at bedtime.   Vitamin D, Ergocalciferol, (DRISDOL)  1.25 MG (50000 UNIT) CAPS capsule, Take 1 capsule (50,000 Units total) by mouth every 7 (seven) days.   Reviewed prior external information including notes and imaging from  primary care provider As well as notes that were available from care everywhere and other healthcare systems.  Past medical history, social, surgical and family history all reviewed in electronic medical record.  No pertanent information unless stated regarding to the chief complaint.   Review of Systems:  No headache, visual changes, nausea, vomiting, diarrhea, constipation, dizziness, abdominal pain, skin rash, fevers, chills, night sweats, weight loss, swollen lymph nodes, body aches, joint swelling, chest pain, shortness of breath, mood changes. POSITIVE muscle aches, fatigue  Objective   Blood pressure 112/78, pulse 72, height '5\' 6"'$  (1.676 m), weight 212 lb (96.2 kg), SpO2 98 %.   General: No apparent distress alert and oriented x3 mood and affect normal, dressed appropriately.  HEENT: Pupils equal, extraocular movements intact  Respiratory: Patient's speak in full sentences and does not appear short of breath  Cardiovascular: No lower extremity edema, non tender, no erythema  Tender to palpation over the lateral epicondylar region.  Worsening pain with resisted wrist and finger extension.  Good grip strength noted.  Patient has 5 out of 5 strength in all extremities.  Mild loss of lordosis.  Limited muscular skeletal ultrasound was performed and interpreted by Hulan Saas, M  Limited ultrasound shows the patient does have hypoechoic changes noted.  Seems to be more increasing Doppler flow in neovascularization consistent with mild intersubstance tearing noted. Impression: Common extensor tendon tear    Impression and Recommendations:      The above documentation has been reviewed and is accurate and complete Cody Pulley, DO

## 2021-11-04 DIAGNOSIS — M5451 Vertebrogenic low back pain: Secondary | ICD-10-CM | POA: Diagnosis not present

## 2021-11-07 ENCOUNTER — Encounter: Payer: Self-pay | Admitting: Family Medicine

## 2021-11-07 ENCOUNTER — Ambulatory Visit: Payer: 59 | Admitting: Family Medicine

## 2021-11-07 ENCOUNTER — Ambulatory Visit: Payer: Self-pay

## 2021-11-07 VITALS — BP 112/78 | HR 72 | Ht 66.0 in | Wt 212.0 lb

## 2021-11-07 DIAGNOSIS — M25521 Pain in right elbow: Secondary | ICD-10-CM

## 2021-11-07 DIAGNOSIS — M2550 Pain in unspecified joint: Secondary | ICD-10-CM

## 2021-11-07 DIAGNOSIS — R7989 Other specified abnormal findings of blood chemistry: Secondary | ICD-10-CM | POA: Diagnosis not present

## 2021-11-07 DIAGNOSIS — G8929 Other chronic pain: Secondary | ICD-10-CM | POA: Diagnosis not present

## 2021-11-07 DIAGNOSIS — E559 Vitamin D deficiency, unspecified: Secondary | ICD-10-CM | POA: Insufficient documentation

## 2021-11-07 DIAGNOSIS — M5441 Lumbago with sciatica, right side: Secondary | ICD-10-CM

## 2021-11-07 DIAGNOSIS — M7712 Lateral epicondylitis, left elbow: Secondary | ICD-10-CM

## 2021-11-07 NOTE — Patient Instructions (Addendum)
Exercises Labs next week See me in 6-8 weeks

## 2021-11-07 NOTE — Assessment & Plan Note (Signed)
Chronic, with exacerbation.  At this time wrist brace given, discussed avoiding extension of the wrist when possible.  We discussed icing regimen and home exercises.  If worsening pain consider formal physical therapy or injections.  Follow-up with me again in 6 to 8 weeks

## 2021-11-07 NOTE — Assessment & Plan Note (Signed)
Patient does have vitamin D deficiency.  Discussed icing regimen and home exercises.  Discussed about vitamin D supplementation and will recheck to make sure it is improving.

## 2021-11-07 NOTE — Assessment & Plan Note (Signed)
Seen another provider at this time and is going to undergo facet injections.

## 2021-11-07 NOTE — Assessment & Plan Note (Signed)
We will recheck and if continuing to be well we will consider adding supplementation.

## 2021-11-14 ENCOUNTER — Other Ambulatory Visit: Payer: Self-pay | Admitting: Orthopedic Surgery

## 2021-11-14 DIAGNOSIS — G8929 Other chronic pain: Secondary | ICD-10-CM

## 2021-11-16 ENCOUNTER — Other Ambulatory Visit (INDEPENDENT_AMBULATORY_CARE_PROVIDER_SITE_OTHER): Payer: 59

## 2021-11-16 DIAGNOSIS — M2550 Pain in unspecified joint: Secondary | ICD-10-CM | POA: Diagnosis not present

## 2021-11-16 LAB — LIPID PANEL
Cholesterol: 178 mg/dL (ref 0–200)
HDL: 45.4 mg/dL (ref >39.00)
LDL Cholesterol: 106 mg/dL — ABNORMAL HIGH (ref 0–99)
NonHDL: 132.36
Total CHOL/HDL Ratio: 4
Triglycerides: 131 mg/dL (ref 0.0–149.0)
VLDL: 26.2 mg/dL (ref 0.0–40.0)

## 2021-11-16 LAB — PSA: PSA: 1.63 ng/mL (ref 0.10–4.00)

## 2021-11-16 LAB — TESTOSTERONE: Testosterone: 371.47 ng/dL (ref 300.00–890.00)

## 2021-11-16 LAB — VITAMIN D 25 HYDROXY (VIT D DEFICIENCY, FRACTURES): VITD: 40.32 ng/mL (ref 30.00–100.00)

## 2021-11-17 LAB — TESTOSTERONE TOTAL,FREE,BIO, MALES
Albumin: 4.2 g/dL (ref 3.6–5.1)
Sex Hormone Binding: 33 nmol/L (ref 10–50)
Testosterone, Bioavailable: 120.3 ng/dL (ref 110.0–575.0)
Testosterone, Free: 62.4 pg/mL (ref 46.0–224.0)
Testosterone: 459 ng/dL (ref 250–827)

## 2021-12-07 ENCOUNTER — Encounter: Payer: Self-pay | Admitting: Neurology

## 2021-12-07 ENCOUNTER — Other Ambulatory Visit (HOSPITAL_COMMUNITY): Payer: Self-pay

## 2021-12-13 ENCOUNTER — Other Ambulatory Visit: Payer: Self-pay | Admitting: Orthopedic Surgery

## 2021-12-13 ENCOUNTER — Ambulatory Visit
Admission: RE | Admit: 2021-12-13 | Discharge: 2021-12-13 | Disposition: A | Discharge status: Home / Self Care | Payer: 59 | Source: Ambulatory Visit | Attending: Orthopedic Surgery | Admitting: Orthopedic Surgery

## 2021-12-13 DIAGNOSIS — G8929 Other chronic pain: Secondary | ICD-10-CM

## 2021-12-13 DIAGNOSIS — M47817 Spondylosis without myelopathy or radiculopathy, lumbosacral region: Secondary | ICD-10-CM | POA: Diagnosis not present

## 2021-12-13 MED ORDER — IOPAMIDOL (ISOVUE-M 200) INJECTION 41%
1.0000 mL | Freq: Once | INTRAMUSCULAR | Status: AC
  Administered 2021-12-13: 1 mL via INTRA_ARTICULAR

## 2021-12-13 NOTE — Discharge Instructions (Signed)

## 2022-01-02 ENCOUNTER — Telehealth: Payer: Self-pay | Admitting: Family Medicine

## 2022-01-02 NOTE — Progress Notes (Deleted)
[place] Phone: [phone] Subjective:    I'm seeing this patient by the request  of:  Juleen Starr, FNP  CC:   [id number]  Cody [name] is a 56 y.o. male coming in with complaint of elbow pain. Patient here for lateral epi injection. Patient states      Past Medical History:  Diagnosis Date   Claustrophobia    Complication of anesthesia    dizzy, difficult time waking up   Hemorrhoid 01/2019   external   Medical history non-contributory    Past Surgical History:  Procedure Laterality Date   HERNIA REPAIR     umbicial   LAPAROSCOPIC CHOLECYSTECTOMY     NECK SURGERY     as a child- patient is not sure what itwas- had cat scratch fever   RADIOLOGY WITH ANESTHESIA N/A 01/13/2016   Procedure: MRI of brain with and without and cervical spine and lumbar spine;  Surgeon: Medication Radiologist, MD;  Location: [facility];  Service: Radiology;  Laterality: N/A;   RADIOLOGY WITH ANESTHESIA N/A 01/21/2019   Procedure: MRI WITH ANESTHESIA CERVICAL SPINE WITHOUT IV CONTRAST;  Surgeon: Radiologist, Medication, MD;  Location: [facility];  Service: Radiology;  Laterality: N/A;   RADIOLOGY WITH ANESTHESIA N/A 09/20/2021   Procedure: MRI WITH ANESTHESIA LUMBAR SPINE WITHOUT CONTRAST;  Surgeon: Radiologist, Medication, MD;  Location: [facility];  Service: Radiology;  Laterality: N/A;   Social History   Socioeconomic History   Marital status: Married    Spouse name: Not on file   Number of children: Not on file   Years of education: Not on file   Highest education level: Not on file  Occupational History   Not on file  Tobacco Use   Smoking status: Never   Smokeless tobacco: Never  Vaping Use   Vaping Use: Never used  Substance and Sexual Activity   Alcohol use: Yes    Comment: occassional   Drug use: No   Sexual activity: Yes  Other Topics Concern   Not on file  Social History Narrative   Not on file    Social Determinants of Health   Financial Resource Strain: Not on file  Food Insecurity: Not on file  Transportation Needs: Not on file  Physical Activity: Not on file  Stress: Not on file  Social Connections: Not on file   No Known Allergies Family History  Adopted: Yes  Problem Relation Age of Onset   Colon polyps Father    Cancer Paternal Uncle        colon at 80.   Colon cancer Paternal Uncle    Esophageal cancer Neg Hx    Rectal cancer Neg Hx    Stomach cancer Neg Hx          Current Outpatient Medications (Other):    cyclobenzaprine (FLEXERIL) 5 MG tablet, Take 1 tablet (5 mg total) by mouth 3 (three) times daily as needed for muscle spasms.   gabapentin (NEURONTIN) 100 MG capsule, Take 2 capsules (200 mg total) by mouth at bedtime.   Vitamin D, Ergocalciferol, (DRISDOL) 1.25 MG (50000 UNIT) CAPS capsule, Take 1 capsule (50,000 Units total) by mouth every 7 (seven) days.   Reviewed prior external information including notes and imaging from  primary care provider As well as notes that were available from care everywhere and other healthcare systems.  Past medical history, social, surgical and family history all reviewed in electronic medical record.  No  pertanent information unless stated regarding to the chief complaint.   Review of Systems:  No headache, visual changes, nausea, vomiting, diarrhea, constipation, dizziness, abdominal pain, skin rash, fevers, chills, night sweats, weight loss, swollen lymph nodes, body aches, joint swelling, chest pain, shortness of breath, mood changes. POSITIVE muscle aches  Objective  There were no vitals taken for this visit.   General: No apparent distress alert and oriented x3 mood and affect normal, dressed appropriately.  HEENT: Pupils equal, extraocular movements intact  Respiratory: Patient's speak in full sentences and does not appear short of breath  Cardiovascular: No lower extremity edema, non tender, no erythema       Impression and Recommendations:

## 2022-01-02 NOTE — Telephone Encounter (Signed)
Patient called needing to reschedule his appointment due to some other scheduling issues with Thanksgiving coming up. He is currently scheduled for Tuesday, 11/28 (but we can move that if needed) for what he thinks will be PRP in his elbow. He had a couple questions about the PRP injection and asked if you could call him to discuss?  Please advise.

## 2022-01-02 NOTE — Progress Notes (Signed)
  [place] Phone: [phone] Subjective:    I'm seeing this patient by the request  of:  Juleen Starr, FNP  CC: right elbow pain   [id number]  11/07/2021 Chronic, with exacerbation.  At this time wrist brace given, discussed avoiding extension of the wrist when possible.  We discussed icing regimen and home exercises.  If worsening pain consider formal physical therapy or injections.  Follow-up with me again in 6 to 8 weeks      Update 01/10/2022 Cody [name] is a 56 y.o. male coming in with complaint of R elbow pain. Patient states that his elbow is doing a lot worse and is needing to get the PRP.        Past Medical History:  Diagnosis Date   Claustrophobia    Complication of anesthesia    dizzy, difficult time waking up   Hemorrhoid 01/2019   external   Medical history non-contributory    Past Surgical History:  Procedure Laterality Date   HERNIA REPAIR     umbicial   LAPAROSCOPIC CHOLECYSTECTOMY     NECK SURGERY     as a child- patient is not sure what itwas- had cat scratch fever   RADIOLOGY WITH ANESTHESIA N/A 01/13/2016   Procedure: MRI of brain with and without and cervical spine and lumbar spine;  Surgeon: Medication Radiologist, MD;  Location: [facility];  Service: Radiology;  Laterality: N/A;   RADIOLOGY WITH ANESTHESIA N/A 01/21/2019   Procedure: MRI WITH ANESTHESIA CERVICAL SPINE WITHOUT IV CONTRAST;  Surgeon: Radiologist, Medication, MD;  Location: [place];  Service: Radiology;  Laterality: N/A;   RADIOLOGY WITH ANESTHESIA N/A 09/20/2021   Procedure: MRI WITH ANESTHESIA LUMBAR SPINE WITHOUT CONTRAST;  Surgeon: Radiologist, Medication, MD;  Location: [place];  Service: Radiology;  Laterality: N/A;     Objective  Blood pressure 122/84, pulse 84, height '5\' 6"'$  (1.676 m), weight 210 lb (95.3 kg), SpO2 98 %.   General: No apparent distress alert and oriented x3 mood and affect normal,  dressed appropriately.  HEENT: Pupils equal, extraocular movements intact  Respiratory: Patient's speak in full sentences and does not appear short of breath  Cardiovascular: No lower extremity edema, non tender, no erythema  Right elbow tender to palpation over the common extensor tendon area.  Patient does have pain over the lateral epicondylar area.  Worsening pain with extension of the wrist.  Good grip strength noted   Procedure: Real-time Ultrasound Guided Injection of right common extensor tendon sheath Device: GE Logiq Q7 Ultrasound guided injection is preferred based studies that show increased duration, increased effect, greater accuracy, decreased procedural pain, increased response rate, and decreased cost with ultrasound guided versus blind injection.  Verbal informed consent obtained.  Time-out conducted.  Noted no overlying erythema, induration, or other signs of local infection.  Skin prepped in a sterile fashion.  Local anesthesia: Topical Ethyl chloride.  With sterile technique and under real time ultrasound guidance: With a 21-gauge 2 inch needle injected with 0.5 cc of 0.5% Marcaine and then injected with 4 cc of bupivacaine. Completed without difficulty  Pain immediately improved suggesting accurate placement of the medication.  Advised to call if fevers/chills, erythema, induration, drainage, or persistent bleeding.  Impression: Technically successful ultrasound guided injection.  Impression and Recommendations:     The above documentation has been reviewed and is accurate and complete [name], DO

## 2022-01-02 NOTE — Telephone Encounter (Signed)
Left message for patient to call back to discuss questions regarding PRP.

## 2022-01-04 ENCOUNTER — Ambulatory Visit: Payer: 59 | Admitting: Family Medicine

## 2022-01-10 ENCOUNTER — Ambulatory Visit: Payer: Self-pay

## 2022-01-10 ENCOUNTER — Ambulatory Visit (INDEPENDENT_AMBULATORY_CARE_PROVIDER_SITE_OTHER): Payer: Self-pay | Admitting: Family Medicine

## 2022-01-10 VITALS — BP 122/84 | HR 84 | Ht 66.0 in | Wt 210.0 lb

## 2022-01-10 DIAGNOSIS — M25521 Pain in right elbow: Secondary | ICD-10-CM

## 2022-01-10 DIAGNOSIS — G2581 Restless legs syndrome: Secondary | ICD-10-CM

## 2022-01-10 DIAGNOSIS — M7712 Lateral epicondylitis, left elbow: Secondary | ICD-10-CM

## 2022-01-10 NOTE — Patient Instructions (Signed)
No ice or IBU for 3 days Heat and Tylenol are ok See me again in 6-7 weeks

## 2022-01-10 NOTE — Assessment & Plan Note (Signed)
Chronic problem that is failed all conservative therapy.  PRP given today.  Post PRP instructions given.  Hopefully will do well and follow-up again in 6 to 8 weeks

## 2022-01-10 NOTE — Assessment & Plan Note (Signed)
Did discuss RLS, likely underlying anxiety is playing a role  Consider effexor

## 2022-01-20 ENCOUNTER — Ambulatory Visit: Payer: 59 | Admitting: Adult Health

## 2022-01-20 ENCOUNTER — Other Ambulatory Visit (HOSPITAL_COMMUNITY): Payer: Self-pay

## 2022-01-20 VITALS — BP 122/74 | HR 80 | Temp 98.3°F | Wt 214.0 lb

## 2022-01-20 DIAGNOSIS — J069 Acute upper respiratory infection, unspecified: Secondary | ICD-10-CM

## 2022-01-20 DIAGNOSIS — R051 Acute cough: Secondary | ICD-10-CM | POA: Diagnosis not present

## 2022-01-20 LAB — POC COVID19 BINAXNOW: SARS Coronavirus 2 Ag: NEGATIVE

## 2022-01-20 MED ORDER — HYDROCODONE BIT-HOMATROP MBR 5-1.5 MG/5ML PO SOLN
5.0000 mL | Freq: Three times a day (TID) | ORAL | 0 refills | Status: DC | PRN
  Filled 2022-01-20: qty 120, 8d supply, fill #0

## 2022-01-20 MED ORDER — FLUTICASONE PROPIONATE 50 MCG/ACT NA SUSP
2.0000 | Freq: Every day | NASAL | 6 refills | Status: DC
  Filled 2022-01-20: qty 16, 30d supply, fill #0
  Filled 2022-02-27: qty 16, 30d supply, fill #1
  Filled 2022-04-02: qty 16, 30d supply, fill #2
  Filled 2022-06-26: qty 16, 30d supply, fill #3
  Filled 2022-10-01: qty 16, 30d supply, fill #4
  Filled 2022-11-15: qty 16, 30d supply, fill #5

## 2022-01-20 MED ORDER — PREDNISONE 10 MG PO TABS
ORAL_TABLET | ORAL | 0 refills | Status: AC
  Filled 2022-01-20: qty 21, 9d supply, fill #0

## 2022-01-20 NOTE — Patient Instructions (Signed)
It was nice meeting you today   I think you have more of an upper respirtory infection that is viral.   I have sent in steroids and flonase. You can use the cough syrup at night if you need - it will make you sleepy.   Your symptoms should improve in the next 5-7 days.

## 2022-01-20 NOTE — Progress Notes (Signed)
Subjective:    Patient ID: Cody [name], male    DOB: 03-28-1965, 56 y.o.   MRN: [id number]  Cough   56 year old male who  has a past medical history of Claustrophobia, Complication of anesthesia, Hemorrhoid (01/2019), and Medical history non-contributory.  He presents to the office today for an acute issue. His symptoms started two days ago. Symptoms include productive cough x 2 days with green mucus chest congestion,  and nasal congestion.    No fevers, no chills.   Has been using sudafed and OTC cough syrup    Review of Systems  Respiratory:  Positive for cough.    See HPI   Past Medical History:  Diagnosis Date   Claustrophobia    Complication of anesthesia    dizzy, difficult time waking up   Hemorrhoid 01/2019   external   Medical history non-contributory     Social History   Socioeconomic History   Marital status: Married    Spouse name: Not on file   Number of children: Not on file   Years of education: Not on file   Highest education level: Not on file  Occupational History   Not on file  Tobacco Use   Smoking status: Never   Smokeless tobacco: Never  Vaping Use   Vaping Use: Never used  Substance and Sexual Activity   Alcohol use: Yes    Comment: occassional   Drug use: No   Sexual activity: Yes  Other Topics Concern   Not on file  Social History Narrative   Not on file   Social Determinants of Health   Financial Resource Strain: Not on file  Food Insecurity: Not on file  Transportation Needs: Not on file  Physical Activity: Not on file  Stress: Not on file  Social Connections: Not on file  Intimate Partner Violence: Not on file    Past Surgical History:  Procedure Laterality Date   [place]     as a child- patient is not sure what itwas- had cat scratch fever   RADIOLOGY WITH ANESTHESIA N/A 01/13/2016   Procedure: MRI of brain with and without and cervical spine  and lumbar spine;  Surgeon: Medication Radiologist, MD;  Location: [facility];  Service: Radiology;  Laterality: N/A;   RADIOLOGY WITH ANESTHESIA N/A 01/21/2019   Procedure: MRI WITH ANESTHESIA CERVICAL SPINE WITHOUT IV CONTRAST;  Surgeon: Radiologist, Medication, MD;  Location: [place];  Service: Radiology;  Laterality: N/A;   RADIOLOGY WITH ANESTHESIA N/A 09/20/2021   Procedure: MRI WITH ANESTHESIA LUMBAR SPINE WITHOUT CONTRAST;  Surgeon: Radiologist, Medication, MD;  Location: [facility];  Service: Radiology;  Laterality: N/A;    Family History  Adopted: Yes  Problem Relation Age of Onset   Colon polyps Father    Cancer Paternal Uncle        colon at 8.   Colon cancer Paternal Uncle    Esophageal cancer Neg Hx    Rectal cancer Neg Hx    Stomach cancer Neg Hx     No Known Allergies  Current Outpatient Medications on File Prior to Visit  Medication Sig Dispense Refill   gabapentin (NEURONTIN) 100 MG capsule Take 2 capsules (200 mg total) by mouth at bedtime. 60 capsule 3   cyclobenzaprine (FLEXERIL) 5 MG tablet Take 1 tablet (5 mg total) by mouth 3 (three) times daily as needed for muscle spasms. (Patient not  taking: Reported on 01/20/2022) 90 tablet 1   Vitamin D, Ergocalciferol, (DRISDOL) 1.25 MG (50000 UNIT) CAPS capsule Take 1 capsule (50,000 Units total) by mouth every 7 (seven) days. (Patient not taking: Reported on 01/20/2022) 12 capsule 0   No current facility-administered medications on file prior to visit.    BP 122/74 (BP Location: Left Arm, Patient Position: Sitting, Cuff Size: Normal)   Pulse 80   Temp 98.3 F (36.8 C) (Oral)   Wt 214 lb (97.1 kg)   SpO2 95%   BMI 34.54 kg/m       Objective:   Physical Exam Vitals and nursing note reviewed.  Constitutional:      Appearance: Normal appearance.  HENT:     Right Ear: Tympanic membrane, ear canal and external ear normal. There is no impacted cerumen.     Left Ear: Tympanic membrane, ear canal and external ear normal. There  is no impacted cerumen.     Nose: Nose normal. No congestion or rhinorrhea.     Right Turbinates: Not enlarged or swollen.     Left Turbinates: Not enlarged or swollen.     Mouth/Throat:     Mouth: Mucous membranes are moist.     Pharynx: Oropharynx is clear.  Cardiovascular:     Rate and Rhythm: Normal rate and regular rhythm.     Pulses: Normal pulses.     Heart sounds: Normal heart sounds.  Pulmonary:     Effort: Pulmonary effort is normal. No respiratory distress.     Breath sounds: Normal breath sounds. No stridor. No wheezing, rhonchi or rales.     Comments: Cough present   Musculoskeletal:        General: Normal range of motion.  Skin:    General: Skin is warm and dry.     Capillary Refill: Capillary refill takes less than 2 seconds.  Neurological:     General: No focal deficit present.     Mental Status: He is alert and oriented to person, place, and time.  Psychiatric:        Mood and Affect: Mood normal.        Behavior: Behavior normal.        Thought Content: Thought content normal.        Judgment: Judgment normal.       Assessment & Plan:  1. Upper respiratory tract infection, unspecified type - Appears viral in nature. Will treat with prednisone and flonase.  - Advised rest and hydration  - Follow up in 5-7 days if not resolved or sooner if symptoms worsen - predniSONE (DELTASONE) 10 MG tablet; Take 4 tablets (40 mg total) by mouth daily for 3 days, THEN 2 tablets (20 mg total) daily for 3 days, THEN 1 tablet (10 mg total) daily for 3 days.  Dispense: 21 tablet; Refill: 0 - fluticasone (FLONASE) 50 MCG/ACT nasal spray; Place 2 sprays into both nostrils daily.  Dispense: 16 g; Refill: 6  2. Acute cough  - POC COVID-19 BinaxNow- negative  - HYDROcodone bit-homatropine (HYCODAN) 5-1.5 MG/5ML syrup; Take 5 mLs by mouth every 8 (eight) hours as needed for cough.  Dispense: 120 mL; Refill: 0  [name] Peng, NP

## 2022-02-14 NOTE — Progress Notes (Signed)
[place] Phone: [phone] Subjective:   [name], am serving as a scribe for Dr. Hulan Saas.  I'm seeing this patient by the request  of:  Martinique, [name], MD  CC: right elbow pain   [id number]  Cody [name] is a 57 y.o. male coming in with complaint of R elbow pain. PRP given on 01/10/2022. Patient states that he is getting worse. No new injury. Intermittent burning over lateral epicondyle. Has stiffness in arm after having elbow flexed. Chopat strap is helpful.        Past Medical History:  Diagnosis Date   Claustrophobia    Complication of anesthesia    dizzy, difficult time waking up   Hemorrhoid 01/2019   external   Medical history non-contributory    Past Surgical History:  Procedure Laterality Date   HERNIA REPAIR     umbicial   LAPAROSCOPIC CHOLECYSTECTOMY     NECK SURGERY     as a child- patient is not sure what itwas- had cat scratch fever   RADIOLOGY WITH ANESTHESIA N/A 01/13/2016   Procedure: MRI of brain with and without and cervical spine and lumbar spine;  Surgeon: Medication Radiologist, MD;  Location: [facility];  Service: Radiology;  Laterality: N/A;   RADIOLOGY WITH ANESTHESIA N/A 01/21/2019   Procedure: MRI WITH ANESTHESIA CERVICAL SPINE WITHOUT IV CONTRAST;  Surgeon: Radiologist, Medication, MD;  Location: [place];  Service: Radiology;  Laterality: N/A;   RADIOLOGY WITH ANESTHESIA N/A 09/20/2021   Procedure: MRI WITH ANESTHESIA LUMBAR SPINE WITHOUT CONTRAST;  Surgeon: Radiologist, Medication, MD;  Location: [facility];  Service: Radiology;  Laterality: N/A;   Social History   Socioeconomic History   Marital status: Married    Spouse name: Not on file   Number of children: Not on file   Years of education: Not on file   Highest education level: Not on file  Occupational History   Not on file  Tobacco Use   Smoking status: Never   Smokeless tobacco: Never  Vaping Use    Vaping Use: Never used  Substance and Sexual Activity   Alcohol use: Yes    Comment: occassional   Drug use: No   Sexual activity: Yes  Other Topics Concern   Not on file  Social History Narrative   Not on file   Social Determinants of Health   Financial Resource Strain: Not on file  Food Insecurity: Not on file  Transportation Needs: Not on file  Physical Activity: Not on file  Stress: Not on file  Social Connections: Not on file   No Known Allergies Family History  Adopted: Yes  Problem Relation Age of Onset   Colon polyps Father    Cancer Paternal Uncle        colon at 67.   Colon cancer Paternal Uncle    Esophageal cancer Neg Hx    Rectal cancer Neg Hx    Stomach cancer Neg Hx       Current Outpatient Medications (Respiratory):    fluticasone (FLONASE) 50 MCG/ACT nasal spray, Place 2 sprays into both nostrils daily.   HYDROcodone bit-homatropine (HYCODAN) 5-1.5 MG/5ML syrup, Take 5 mLs by mouth every 8 (eight) hours as needed for cough.    Current Outpatient Medications (Other):    cyclobenzaprine (FLEXERIL) 5 MG tablet, Take 1 tablet (5 mg total) by mouth 3 (three) times daily as needed for muscle spasms.   gabapentin (NEURONTIN)  100 MG capsule, Take 2 capsules (200 mg total) by mouth at bedtime.   Vitamin D, Ergocalciferol, (DRISDOL) 1.25 MG (50000 UNIT) CAPS capsule, Take 1 capsule (50,000 Units total) by mouth every 7 (seven) days.   Reviewed prior external information including notes and imaging from  primary care provider As well as notes that were available from care everywhere and other healthcare systems.  Past medical history, social, surgical and family history all reviewed in electronic medical record.  No pertanent information unless stated regarding to the chief complaint.   Review of Systems:  No headache, visual changes, nausea, vomiting, diarrhea, constipation, dizziness, abdominal pain, skin rash, fevers, chills, night sweats, weight loss,  swollen lymph nodes, body aches, joint swelling, chest pain, shortness of breath, mood changes. POSITIVE muscle aches  Objective  Blood pressure 110/82, pulse 70, height '5\' 6"'$  (1.676 m), SpO2 97 %.   General: No apparent distress alert and oriented x3 mood and affect normal, dressed appropriately.  HEENT: Pupils equal, extraocular movements intact  Respiratory: Patient's speak in full sentences and does not appear short of breath  Cardiovascular: No lower extremity edema, non tender, no erythema  Elbow pain shows trace amount of effusion noted on the elbow on the lateral aspect.  Tender to palpation severely compared to the contralateral side.  Patient states weakness.  Worsening pain with any type of resisted extension of the wrist.   Limited muscular skeletal ultrasound was performed and interpreted by Hulan Saas, M  Limited ultrasound does show some increase in neovascularization.  Some mild scar tissue formation that does seem to be improvement but a cortical irregularity noted could be a small avulsion of the first possible loose body. Impression: Very mild interval improvement of the common extensor tear but continued cortical irregularity.   Impression and Recommendations:    The above documentation has been reviewed and is accurate and complete [name], DO

## 2022-02-15 ENCOUNTER — Ambulatory Visit: Payer: 59 | Admitting: Neurology

## 2022-02-21 ENCOUNTER — Encounter: Payer: Self-pay | Admitting: Family Medicine

## 2022-02-21 ENCOUNTER — Ambulatory Visit (INDEPENDENT_AMBULATORY_CARE_PROVIDER_SITE_OTHER): Payer: Commercial Managed Care - PPO | Admitting: Family Medicine

## 2022-02-21 ENCOUNTER — Ambulatory Visit: Payer: Self-pay

## 2022-02-21 ENCOUNTER — Ambulatory Visit (INDEPENDENT_AMBULATORY_CARE_PROVIDER_SITE_OTHER): Payer: Commercial Managed Care - PPO

## 2022-02-21 VITALS — BP 110/82 | HR 70 | Ht 66.0 in

## 2022-02-21 DIAGNOSIS — M7712 Lateral epicondylitis, left elbow: Secondary | ICD-10-CM

## 2022-02-21 DIAGNOSIS — M25521 Pain in right elbow: Secondary | ICD-10-CM | POA: Diagnosis not present

## 2022-02-21 NOTE — Assessment & Plan Note (Signed)
Patient has failed all conservative therapy including formal physical therapy, oral and topical anti-inflammatories, icing regimen, as well as PRP injection at this time.  Necessary.  Patient does have severe claustrophobia so we will look for a MRI would be extremity MRI.  Follow-up with me again after this test different management.

## 2022-02-21 NOTE — Patient Instructions (Addendum)
R elbow xray We will be in touch about MRI

## 2022-02-28 ENCOUNTER — Other Ambulatory Visit: Payer: Self-pay

## 2022-02-28 DIAGNOSIS — M25521 Pain in right elbow: Secondary | ICD-10-CM

## 2022-03-01 ENCOUNTER — Other Ambulatory Visit: Payer: Self-pay

## 2022-03-01 DIAGNOSIS — L814 Other melanin hyperpigmentation: Secondary | ICD-10-CM | POA: Diagnosis not present

## 2022-03-01 DIAGNOSIS — L821 Other seborrheic keratosis: Secondary | ICD-10-CM | POA: Diagnosis not present

## 2022-03-01 DIAGNOSIS — L578 Other skin changes due to chronic exposure to nonionizing radiation: Secondary | ICD-10-CM | POA: Diagnosis not present

## 2022-03-01 DIAGNOSIS — L723 Sebaceous cyst: Secondary | ICD-10-CM | POA: Diagnosis not present

## 2022-03-01 DIAGNOSIS — Z808 Family history of malignant neoplasm of other organs or systems: Secondary | ICD-10-CM | POA: Diagnosis not present

## 2022-03-01 DIAGNOSIS — M25521 Pain in right elbow: Secondary | ICD-10-CM

## 2022-03-01 DIAGNOSIS — D225 Melanocytic nevi of trunk: Secondary | ICD-10-CM | POA: Diagnosis not present

## 2022-03-07 ENCOUNTER — Ambulatory Visit: Payer: 59 | Admitting: Neurology

## 2022-03-22 NOTE — Progress Notes (Signed)
[place] Phone: [phone] Subjective:   Cody [name], am serving as a scribe for Dr. Hulan Saas.  I'm seeing this patient by the request  of:  [place], [name], MD  CC: right elbow and right knee pain   [id number]  Cody [name] is a 57 y.o. male coming in with complaint of R elbow and R knee pain. Patient states that on Monday he woke up with pain in medial aspect of R knee. Antalgic gait. Unable to walk for exercise due to pain.      Past Medical History:  Diagnosis Date   Claustrophobia    Complication of anesthesia    dizzy, difficult time waking up   Hemorrhoid 01/2019   external   Medical history non-contributory    Past Surgical History:  Procedure Laterality Date   HERNIA REPAIR     umbicial   LAPAROSCOPIC CHOLECYSTECTOMY     NECK SURGERY     as a child- patient is not sure what itwas- had cat scratch fever   RADIOLOGY WITH ANESTHESIA N/A 01/13/2016   Procedure: MRI of brain with and without and cervical spine and lumbar spine;  Surgeon: Medication Radiologist, MD;  Location: [facility];  Service: Radiology;  Laterality: N/A;   RADIOLOGY WITH ANESTHESIA N/A 01/21/2019   Procedure: MRI WITH ANESTHESIA CERVICAL SPINE WITHOUT IV CONTRAST;  Surgeon: Radiologist, Medication, MD;  Location: [facility];  Service: Radiology;  Laterality: N/A;   RADIOLOGY WITH ANESTHESIA N/A 09/20/2021   Procedure: MRI WITH ANESTHESIA LUMBAR SPINE WITHOUT CONTRAST;  Surgeon: Radiologist, Medication, MD;  Location: [facility];  Service: Radiology;  Laterality: N/A;   Social History   Socioeconomic History   Marital status: Married    Spouse name: Not on file   Number of children: Not on file   Years of education: Not on file   Highest education level: Not on file  Occupational History   Not on file  Tobacco Use   Smoking status: Never   Smokeless tobacco: Never  Vaping Use   Vaping Use: Never used  Substance and  Sexual Activity   Alcohol use: Yes    Comment: occassional   Drug use: [name]   Sexual activity: Yes  Other Topics Concern   Not on file  Social History Narrative   Not on file   Social Determinants of Health   Financial Resource Strain: Not on file  Food Insecurity: Not on file  Transportation Needs: Not on file  Physical Activity: Not on file  Stress: Not on file  Social Connections: Not on file   [name] Known Allergies Family History  Adopted: Yes  Problem Relation Age of Onset   Colon polyps Father    Cancer Paternal Uncle        colon at 21.   Colon cancer Paternal Uncle    Esophageal cancer Neg Hx    Rectal cancer Neg Hx    Stomach cancer Neg Hx     Current Outpatient Medications (Endocrine & Metabolic):    predniSONE (DELTASONE) 20 MG tablet, Take 2 tablets (40 mg total) by mouth daily with breakfast.   Current Outpatient Medications (Respiratory):    fluticasone (FLONASE) 50 MCG/ACT nasal spray, Place 2 sprays into both nostrils daily.   HYDROcodone bit-homatropine (HYCODAN) 5-1.5 MG/5ML syrup, Take 5 mLs by mouth every 8 (eight) hours as needed for cough.    Current Outpatient Medications (Other):    cyclobenzaprine (FLEXERIL)  5 MG tablet, Take 1 tablet (5 mg total) by mouth 3 (three) times daily as needed for muscle spasms.   gabapentin (NEURONTIN) 100 MG capsule, Take 2 capsules (200 mg total) by mouth at bedtime.   Vitamin D, Ergocalciferol, (DRISDOL) 1.25 MG (50000 UNIT) CAPS capsule, Take 1 capsule (50,000 Units total) by mouth every 7 (seven) days.   Reviewed prior external information including notes and imaging from  primary care provider As well as notes that were available from care everywhere and other healthcare systems.  Past medical history, social, surgical and family history all reviewed in electronic medical record.  [name] pertanent information unless stated regarding to the chief complaint.   Review of Systems:  [name] headache, visual changes,  nausea, vomiting, diarrhea, constipation, dizziness, abdominal pain, skin rash, fevers, chills, night sweats, weight loss, swollen lymph nodes, body aches, joint swelling, chest pain, shortness of breath, mood changes. POSITIVE muscle aches  Objective  Blood pressure 110/72, pulse 73, height 5' 6"$  (1.676 m), SpO2 96 %.   General: [name] apparent distress alert and oriented x3 mood and affect normal, dressed appropriately.  HEENT: Pupils equal, extraocular movements intact  Respiratory: Patient's speak in full sentences and does not appear short of breath  Cardiovascular: [name] lower extremity edema, non tender, [name] erythema  Right knee pain noted on medial aspect of the knee negative mcmurrys [name] instability more tenderness over the hamstring than anywhere else.  [name] pain with calf compression.  [name] significant swelling in any of the lower extremities.  Limited muscular skeletal ultrasound was performed and interpreted by Hulan Saas, M  Limited ultrasound of patient's knee does show that there is some mild patellofemoral hypoechoic changes noted.  Patient medial and lateral meniscus appear to be unremarkable.  [name] significant bony abnormality noted.  MCL is intact.  Baker's cyst noted. Impression: Very nonspecific mild swelling in the patellofemoral joint     Impression and Recommendations:    The above documentation has been reviewed and is accurate and complete [name], DO

## 2022-03-24 ENCOUNTER — Ambulatory Visit: Payer: Commercial Managed Care - PPO | Admitting: Family Medicine

## 2022-03-24 ENCOUNTER — Encounter: Payer: Self-pay | Admitting: Family Medicine

## 2022-03-24 ENCOUNTER — Other Ambulatory Visit (HOSPITAL_COMMUNITY): Payer: Self-pay

## 2022-03-24 ENCOUNTER — Ambulatory Visit (INDEPENDENT_AMBULATORY_CARE_PROVIDER_SITE_OTHER): Payer: Commercial Managed Care - PPO

## 2022-03-24 ENCOUNTER — Ambulatory Visit: Payer: Self-pay

## 2022-03-24 VITALS — BP 110/72 | HR 73 | Ht 66.0 in

## 2022-03-24 DIAGNOSIS — M7711 Lateral epicondylitis, right elbow: Secondary | ICD-10-CM | POA: Diagnosis not present

## 2022-03-24 DIAGNOSIS — M25561 Pain in right knee: Secondary | ICD-10-CM | POA: Diagnosis not present

## 2022-03-24 DIAGNOSIS — S76311A Strain of muscle, fascia and tendon of the posterior muscle group at thigh level, right thigh, initial encounter: Secondary | ICD-10-CM | POA: Diagnosis not present

## 2022-03-24 MED ORDER — PREDNISONE 20 MG PO TABS
40.0000 mg | ORAL_TABLET | Freq: Every day | ORAL | 0 refills | Status: DC
  Filled 2022-03-24: qty 14, 7d supply, fill #0

## 2022-03-24 NOTE — Patient Instructions (Addendum)
Prednisone 83m for 7 days Xray today Heel lift 1/8 of an inch Keep icing I'll see if Dr. GAmedeo Plentycan do anything for you See me again in in 7-8 weeks

## 2022-03-24 NOTE — Assessment & Plan Note (Addendum)
Patient does have a hamstring strain more than what seems to be the knee.  On ultrasound does seem to have some hypoechoic changes in the patellofemoral joint.  Discussed icing regimen and home exercises.  Discussed potential compression sleeve.  Hold on any type of injection.  No significant instability of the knee and patient is able to do a heel lift and start increasing activity as tolerated.  If does not respond to this would be willing to see patient again.  Patient knows if worsening pain seek medical attention immediately.

## 2022-03-24 NOTE — Assessment & Plan Note (Signed)
Still awaiting surgery, unable to do MRI from claustrophobia

## 2022-03-27 ENCOUNTER — Other Ambulatory Visit: Payer: Self-pay

## 2022-03-27 DIAGNOSIS — M25562 Pain in left knee: Secondary | ICD-10-CM

## 2022-04-17 ENCOUNTER — Other Ambulatory Visit (HOSPITAL_BASED_OUTPATIENT_CLINIC_OR_DEPARTMENT_OTHER): Payer: Self-pay

## 2022-04-17 DIAGNOSIS — M7711 Lateral epicondylitis, right elbow: Secondary | ICD-10-CM | POA: Diagnosis not present

## 2022-04-17 MED ORDER — DIAZEPAM 5 MG PO TABS
ORAL_TABLET | ORAL | 0 refills | Status: DC
  Filled 2022-04-17: qty 2, 1d supply, fill #0

## 2022-04-22 ENCOUNTER — Other Ambulatory Visit (HOSPITAL_BASED_OUTPATIENT_CLINIC_OR_DEPARTMENT_OTHER): Payer: Self-pay

## 2022-04-25 ENCOUNTER — Ambulatory Visit: Payer: 59 | Admitting: Neurology

## 2022-05-02 DIAGNOSIS — M1711 Unilateral primary osteoarthritis, right knee: Secondary | ICD-10-CM | POA: Diagnosis not present

## 2022-05-04 ENCOUNTER — Ambulatory Visit: Payer: Commercial Managed Care - PPO | Admitting: Family Medicine

## 2022-05-09 DIAGNOSIS — M25561 Pain in right knee: Secondary | ICD-10-CM | POA: Diagnosis not present

## 2022-05-22 DIAGNOSIS — M1711 Unilateral primary osteoarthritis, right knee: Secondary | ICD-10-CM | POA: Diagnosis not present

## 2022-05-22 DIAGNOSIS — S83241D Other tear of medial meniscus, current injury, right knee, subsequent encounter: Secondary | ICD-10-CM | POA: Diagnosis not present

## 2022-05-26 ENCOUNTER — Other Ambulatory Visit: Payer: Self-pay | Admitting: Family Medicine

## 2022-05-29 ENCOUNTER — Other Ambulatory Visit (HOSPITAL_BASED_OUTPATIENT_CLINIC_OR_DEPARTMENT_OTHER): Payer: Self-pay

## 2022-05-29 MED ORDER — GABAPENTIN 100 MG PO CAPS
200.0000 mg | ORAL_CAPSULE | Freq: Every day | ORAL | 3 refills | Status: DC
Start: 2022-05-29 — End: 2022-08-14
  Filled 2022-05-29: qty 60, 30d supply, fill #0
  Filled 2022-07-01: qty 60, 30d supply, fill #1
  Filled 2022-07-31: qty 60, 30d supply, fill #2

## 2022-06-06 DIAGNOSIS — S83241A Other tear of medial meniscus, current injury, right knee, initial encounter: Secondary | ICD-10-CM | POA: Diagnosis not present

## 2022-06-06 DIAGNOSIS — S83411A Sprain of medial collateral ligament of right knee, initial encounter: Secondary | ICD-10-CM | POA: Diagnosis not present

## 2022-06-19 ENCOUNTER — Other Ambulatory Visit (HOSPITAL_BASED_OUTPATIENT_CLINIC_OR_DEPARTMENT_OTHER): Payer: Self-pay

## 2022-06-19 DIAGNOSIS — M6751 Plica syndrome, right knee: Secondary | ICD-10-CM | POA: Diagnosis not present

## 2022-06-19 DIAGNOSIS — G8918 Other acute postprocedural pain: Secondary | ICD-10-CM | POA: Diagnosis not present

## 2022-06-19 DIAGNOSIS — M94261 Chondromalacia, right knee: Secondary | ICD-10-CM | POA: Diagnosis not present

## 2022-06-19 DIAGNOSIS — M65861 Other synovitis and tenosynovitis, right lower leg: Secondary | ICD-10-CM | POA: Diagnosis not present

## 2022-06-19 DIAGNOSIS — S83231A Complex tear of medial meniscus, current injury, right knee, initial encounter: Secondary | ICD-10-CM | POA: Diagnosis not present

## 2022-06-19 MED ORDER — ONDANSETRON 4 MG PO TBDP
ORAL_TABLET | ORAL | 0 refills | Status: DC
  Filled 2022-06-19: qty 20, 7d supply, fill #0

## 2022-06-19 MED ORDER — HYDROCODONE-ACETAMINOPHEN 5-325 MG PO TABS
1.0000 | ORAL_TABLET | Freq: Four times a day (QID) | ORAL | 0 refills | Status: DC | PRN
  Filled 2022-06-19: qty 22, 6d supply, fill #0

## 2022-06-20 ENCOUNTER — Ambulatory Visit: Payer: 59 | Admitting: Neurology

## 2022-06-26 ENCOUNTER — Other Ambulatory Visit (HOSPITAL_BASED_OUTPATIENT_CLINIC_OR_DEPARTMENT_OTHER): Payer: Self-pay

## 2022-06-26 DIAGNOSIS — M7711 Lateral epicondylitis, right elbow: Secondary | ICD-10-CM | POA: Diagnosis not present

## 2022-07-08 IMAGING — XA Imaging study
2 series · 2 of 2 positions shown · non-contrast
Comparison: none

CLINICAL DATA: Pleasant 55-year-old gentleman with lumbosacral
spondylosis without myelopathy. Primary symptoms are low back pain
worse on the right than the left and associated with muscle spasm.

[Series 1: ortho standard · 1 of 1 slices shown (1 of 2)]
[im 1/1]
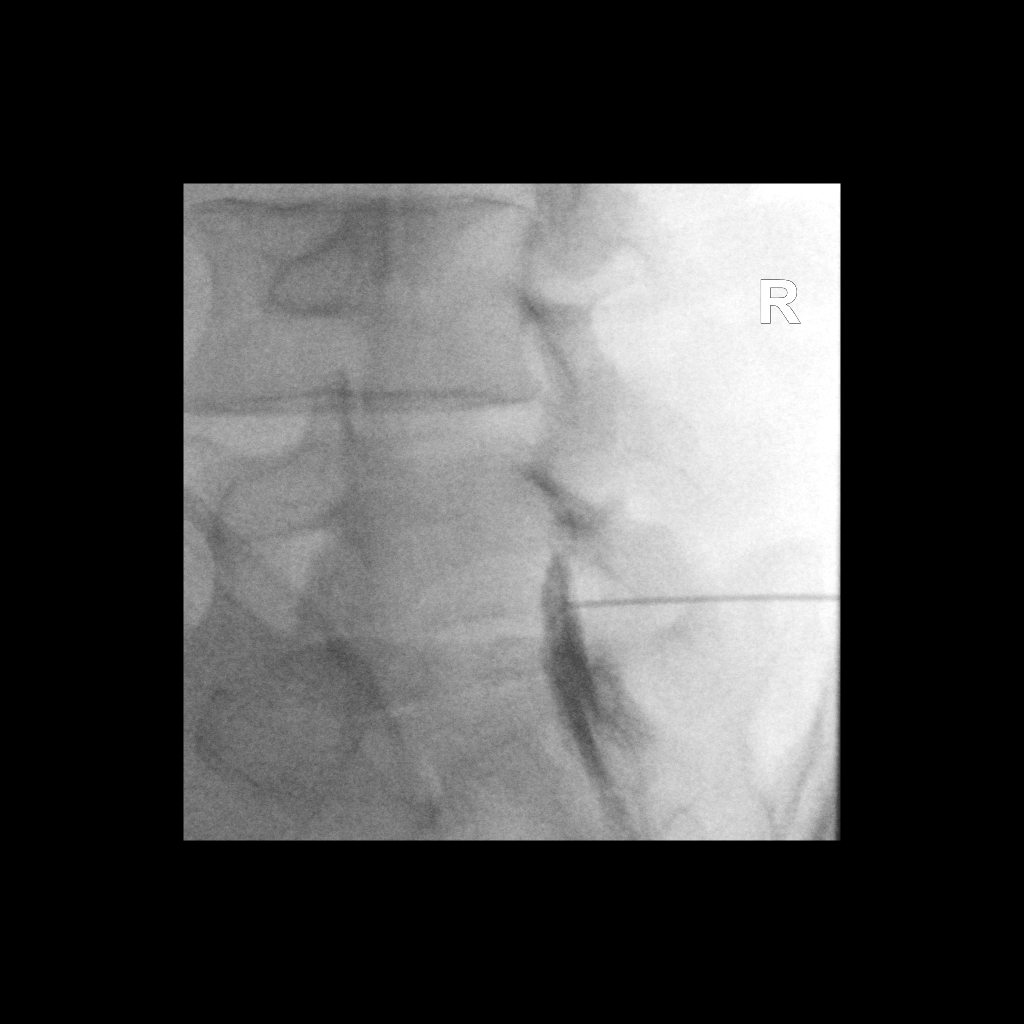

[Series 2: ortho standard · 1 of 1 slices shown (2 of 2)]
[im 1/1]
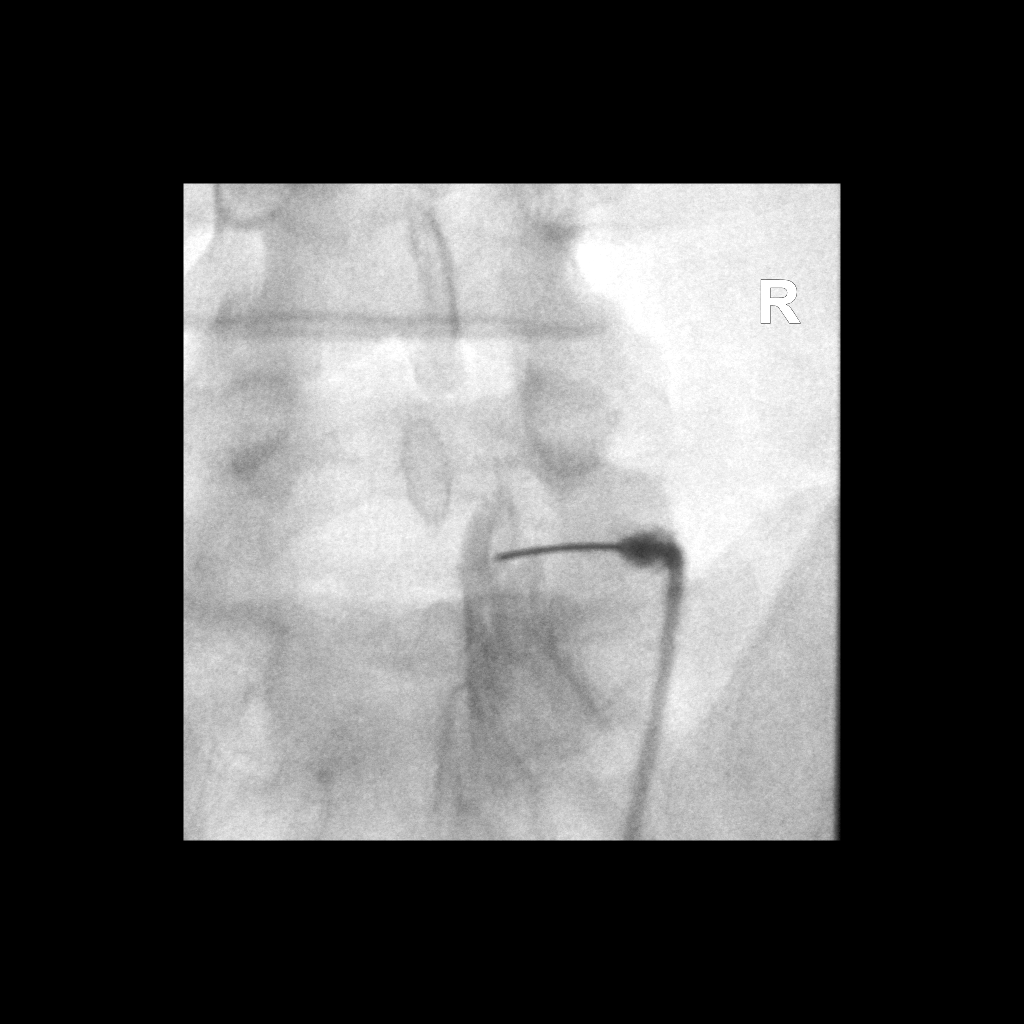

[2 of 2 positions shown; findings below may reference images not displayed]

FLUOROSCOPY:
Radiation Exposure Index (as provided by the fluoroscopic device):
4.7 mGy Kerma

PROCEDURE:
The procedure, risks, benefits, and alternatives were explained to
the patient. Questions regarding the procedure were encouraged and
answered. The patient understands and consents to the procedure.

LUMBAR EPIDURAL INJECTION:

An interlaminar approach was performed on right at L5-S1. The
overlying skin was cleansed and anesthetized. A 20 gauge epidural
needle was advanced using loss-of-resistance technique.

DIAGNOSTIC EPIDURAL INJECTION:

Injection of Isovue-M 200 shows a good epidural pattern with spread
above and below the level of needle placement, primarily on the
right no vascular opacification is seen.

THERAPEUTIC EPIDURAL INJECTION:

80 mg of Depo-Medrol mixed with 2 mL 1% lidocaine were instilled.
The procedure was well-tolerated, and the patient was discharged
thirty minutes following the injection in good condition.

COMPLICATIONS:
None.
IMPRESSION: Technically successful epidural injection on the right L5-S1 #1.

## 2022-07-12 ENCOUNTER — Other Ambulatory Visit: Payer: Self-pay

## 2022-07-12 ENCOUNTER — Encounter: Payer: Self-pay | Admitting: Family Medicine

## 2022-07-12 ENCOUNTER — Other Ambulatory Visit (HOSPITAL_BASED_OUTPATIENT_CLINIC_OR_DEPARTMENT_OTHER): Payer: Self-pay

## 2022-07-12 ENCOUNTER — Ambulatory Visit: Payer: Commercial Managed Care - PPO | Admitting: Family Medicine

## 2022-07-12 ENCOUNTER — Other Ambulatory Visit (HOSPITAL_COMMUNITY): Payer: Self-pay

## 2022-07-12 VITALS — BP 110/72 | HR 76 | Ht 66.0 in

## 2022-07-12 DIAGNOSIS — M25571 Pain in right ankle and joints of right foot: Secondary | ICD-10-CM

## 2022-07-12 DIAGNOSIS — S76311A Strain of muscle, fascia and tendon of the posterior muscle group at thigh level, right thigh, initial encounter: Secondary | ICD-10-CM

## 2022-07-12 MED ORDER — VITAMIN D (ERGOCALCIFEROL) 1.25 MG (50000 UNIT) PO CAPS
50000.0000 [IU] | ORAL_CAPSULE | ORAL | 0 refills | Status: DC
  Filled 2022-07-12: qty 12, 84d supply, fill #0

## 2022-07-12 NOTE — Addendum Note (Signed)
Addended by: Ethlyn Daniels on: 07/12/2022 02:01 PM   Modules accepted: Orders

## 2022-07-12 NOTE — Assessment & Plan Note (Signed)
Discussed with patient about which activities to do and which ones to avoid.  Increase activity slowly otherwise.  Discussed which activities to do and which ones to avoid.  We discussed that it does seem that patient has had pes anserine bursitis also noted that could potentially respond to an injection but I do think a lot of his postsurgical pain is also better as well.  We discussed icing regimen and home exercises otherwise.  Increase activity slowly.  Follow-up with me again in 3-4 weeks.  Did discuss that walking and running in a pool setting could be beneficial for this individual while he rehabs.

## 2022-07-12 NOTE — Progress Notes (Signed)
[name] [address] [place] [address] Phone: [phone] Subjective:   Cody [name], am serving as a scribe for Dr. Antoine Primas.  I'm seeing this patient by the request  of:  [name], [name], MD  CC: right knee pain follow up   [id number]  Cody [name] is a 57 y.o. male coming in with complaint of R knee pain. Patient states that he has arthroscopic pain on R knee 3 weeks ago. Pain over medial aspect. 10/10 pain. He feels the same as he did prior to surgery. Is not in PT. Walking 1 mile a day at 22 minutes and every step is painful.       Past Medical History:  Diagnosis Date   Claustrophobia    Complication of anesthesia    dizzy, difficult time waking up   Hemorrhoid 01/2019   external   Medical history non-contributory    Past Surgical History:  Procedure Laterality Date   HERNIA REPAIR     umbicial   LAPAROSCOPIC CHOLECYSTECTOMY     NECK SURGERY     as a child- patient is not sure what itwas- had cat scratch fever   RADIOLOGY WITH ANESTHESIA N/A 01/13/2016   Procedure: MRI of brain with and without and cervical spine and lumbar spine;  Surgeon: Medication Radiologist, MD;  Location: [facility] OR;  Service: Radiology;  Laterality: N/A;   RADIOLOGY WITH ANESTHESIA N/A 01/21/2019   Procedure: MRI WITH ANESTHESIA CERVICAL SPINE WITHOUT IV CONTRAST;  Surgeon: Radiologist, Medication, MD;  Location: [facility] OR;  Service: Radiology;  Laterality: N/A;   RADIOLOGY WITH ANESTHESIA N/A 09/20/2021   Procedure: MRI WITH ANESTHESIA LUMBAR SPINE WITHOUT CONTRAST;  Surgeon: Radiologist, Medication, MD;  Location: [facility] OR;  Service: Radiology;  Laterality: N/A;   Social History   Socioeconomic History   Marital status: Married    Spouse name: Not on file   Number of children: Not on file   Years of education: Not on file   Highest education level: Not on file  Occupational History   Not on file  Tobacco Use   Smoking status: Never    Smokeless tobacco: Never  Vaping Use   Vaping Use: Never used  Substance and Sexual Activity   Alcohol use: Yes    Comment: occassional   Drug use: No   Sexual activity: Yes  Other Topics Concern   Not on file  Social History Narrative   Not on file   Social Determinants of Health   Financial Resource Strain: Not on file  Food Insecurity: Not on file  Transportation Needs: Not on file  Physical Activity: Not on file  Stress: Not on file  Social Connections: Not on file   No Known Allergies Family History  Adopted: Yes  Problem Relation Age of Onset   Colon polyps Father    Cancer Paternal Uncle        colon at 78.   Colon cancer Paternal Uncle    Esophageal cancer Neg Hx    Rectal cancer Neg Hx    Stomach cancer Neg Hx     Current Outpatient Medications (Endocrine & Metabolic):    predniSONE (DELTASONE) 20 MG tablet, Take 2 tablets (40 mg total) by mouth daily with breakfast.   Current Outpatient Medications (Respiratory):    fluticasone (FLONASE) 50 MCG/ACT nasal spray, Place 2 sprays into both nostrils daily.   HYDROcodone bit-homatropine (HYCODAN) 5-1.5 MG/5ML syrup, Take 5 mLs by mouth every 8 (eight)  hours as needed for cough.  Current Outpatient Medications (Analgesics):    HYDROcodone-acetaminophen (NORCO/VICODIN) 5-325 MG tablet, Take 1 tablet every 6 hours by oral route as needed, for moderate to severe pain.   Current Outpatient Medications (Other):    Vitamin D, Ergocalciferol, (DRISDOL) 1.25 MG (50000 UNIT) CAPS capsule, Take 1 capsule (50,000 Units total) by mouth every 7 (seven) days.   cyclobenzaprine (FLEXERIL) 5 MG tablet, Take 1 tablet (5 mg total) by mouth 3 (three) times daily as needed for muscle spasms.   diazepam (VALIUM) 5 MG tablet, Take 1-2 tablet(s) by mouth 30 minutes prior to MRI.   gabapentin (NEURONTIN) 100 MG capsule, Take 2 capsules (200 mg total) by mouth at bedtime.   ondansetron (ZOFRAN-ODT) 4 MG disintegrating tablet, Place 1  tablet under tongue every 8 hours as needed, for nausea/vomiting.   Reviewed prior external information including notes and imaging from  primary care provider As well as notes that were available from care everywhere and other healthcare systems.  Past medical history, social, surgical and family history all reviewed in electronic medical record.  No pertanent information unless stated regarding to the chief complaint.   Review of Systems:  No headache, visual changes, nausea, vomiting, diarrhea, constipation, dizziness, abdominal pain, skin rash, fevers, chills, night sweats, weight loss, swollen lymph nodes, body aches, joint swelling, chest pain, shortness of breath, mood changes. POSITIVE muscle aches  Objective  Blood pressure 110/72, pulse 76, height 5\' 6"  (1.676 m).   General: No apparent distress alert and oriented x3 mood and affect normal, dressed appropriately.  HEENT: Pupils equal, extraocular movements intact  Respiratory: Patient's speak in full sentences and does not appear short of breath  Cardiovascular: No lower extremity edema, non tender, no erythema  Right knee exam shows patient does have tenderness to palpation over the medial joint line.  Out of proportion to the amount of palpation.  Full range of motion noted.  Patient is able to ambulate with very mild discomfort but he states noted at the moment.  He is able to get up from a sitting position fairly regularly.  Limited muscular skeletal ultrasound was performed and interpreted by Antoine Primas, M   Postsurgical changes noted in the medial meniscus of the posterior aspect.  No cortical irregularity noted of the bone noted at the moment.  Hypoechoic changes that do seem to be consistent with more of Pez anserine bursitis noted. Impression: Postsurgical changes that are appropriate with potential pes anserine bursitis    Impression and Recommendations:    The above documentation has been reviewed and is accurate  and complete Judi Saa, DO

## 2022-07-12 NOTE — Patient Instructions (Signed)
Once weekly vit D Aqua runner Ice 20 min 2x a day See me in 4-5 weeks

## 2022-07-13 ENCOUNTER — Other Ambulatory Visit (HOSPITAL_COMMUNITY): Payer: Self-pay

## 2022-07-17 ENCOUNTER — Encounter: Payer: Self-pay | Admitting: Family Medicine

## 2022-07-17 ENCOUNTER — Ambulatory Visit: Payer: Commercial Managed Care - PPO | Admitting: Family Medicine

## 2022-07-17 ENCOUNTER — Other Ambulatory Visit: Payer: Self-pay

## 2022-07-17 VITALS — BP 118/82 | Ht 66.0 in

## 2022-07-17 DIAGNOSIS — M7050 Other bursitis of knee, unspecified knee: Secondary | ICD-10-CM

## 2022-07-17 DIAGNOSIS — M25561 Pain in right knee: Secondary | ICD-10-CM

## 2022-07-17 NOTE — Assessment & Plan Note (Signed)
Patient does have more of a pes anserine bursitis that could be reactive from the surgery.  Could be the compensation as well.  Hopefully patient responds well to the injection.  We discussed continuing to follow-up with his orthopedic surgeon as well.  May need formal physical therapy again.  Increase activity slowly.  Follow-up again in 6 to 8 weeks

## 2022-07-17 NOTE — Progress Notes (Signed)
[name] [address] [place] [address] Phone: [phone] Subjective:   Bruce Donath, am serving as a scribe for Dr. Antoine Primas.  I'm seeing this patient by the request  of:  [name], [name], MD  CC: right knee pain   [id number]  Cody [name] is a 57 y.o. male coming in with complaint of right knee pain.  Patient was seen previously and seem to have more of a reactive Pez anserine bursitis secondary to his surgery and decreased activity.  Patient was to start exercises.  Feels like the pain is intolerable and wanted to do something more.  Still has follow-up with orthopedics in the near future with patient only being 4 weeks out from surgery.  Affecting daily activities and can even wake him up at night.        Past Medical History:  Diagnosis Date   Claustrophobia    Complication of anesthesia    dizzy, difficult time waking up   Hemorrhoid 01/2019   external   Medical history non-contributory    Past Surgical History:  Procedure Laterality Date   HERNIA REPAIR     umbicial   LAPAROSCOPIC CHOLECYSTECTOMY     NECK SURGERY     as a child- patient is not sure what itwas- had cat scratch fever   RADIOLOGY WITH ANESTHESIA N/A 01/13/2016   Procedure: MRI of brain with and without and cervical spine and lumbar spine;  Surgeon: Medication Radiologist, MD;  Location: [facility];  Service: Radiology;  Laterality: N/A;   RADIOLOGY WITH ANESTHESIA N/A 01/21/2019   Procedure: MRI WITH ANESTHESIA CERVICAL SPINE WITHOUT IV CONTRAST;  Surgeon: Radiologist, Medication, MD;  Location: [facility];  Service: Radiology;  Laterality: N/A;   RADIOLOGY WITH ANESTHESIA N/A 09/20/2021   Procedure: MRI WITH ANESTHESIA LUMBAR SPINE WITHOUT CONTRAST;  Surgeon: Radiologist, Medication, MD;  Location: [facility];  Service: Radiology;  Laterality: N/A;   Social History   Socioeconomic History   Marital status: Married    Spouse name: Not on file   Number of  children: Not on file   Years of education: Not on file   Highest education level: Not on file  Occupational History   Not on file  Tobacco Use   Smoking status: Never   Smokeless tobacco: Never  Vaping Use   Vaping Use: Never used  Substance and Sexual Activity   Alcohol use: Yes    Comment: occassional   Drug use: No   Sexual activity: Yes  Other Topics Concern   Not on file  Social History Narrative   Not on file   Social Determinants of Health   Financial Resource Strain: Not on file  Food Insecurity: Not on file  Transportation Needs: Not on file  Physical Activity: Not on file  Stress: Not on file  Social Connections: Not on file   No Known Allergies Family History  Adopted: Yes  Problem Relation Age of Onset   Colon polyps Father    Cancer Paternal Uncle        colon at 77.   Colon cancer Paternal Uncle    Esophageal cancer Neg Hx    Rectal cancer Neg Hx    Stomach cancer Neg Hx     Current Outpatient Medications (Endocrine & Metabolic):    predniSONE (DELTASONE) 20 MG tablet, Take 2 tablets (40 mg total) by mouth daily with breakfast.   Current Outpatient Medications (Respiratory):    fluticasone (FLONASE) 50  MCG/ACT nasal spray, Place 2 sprays into both nostrils daily.   HYDROcodone bit-homatropine (HYCODAN) 5-1.5 MG/5ML syrup, Take 5 mLs by mouth every 8 (eight) hours as needed for cough.  Current Outpatient Medications (Analgesics):    HYDROcodone-acetaminophen (NORCO/VICODIN) 5-325 MG tablet, Take 1 tablet every 6 hours by oral route as needed, for moderate to severe pain.   Current Outpatient Medications (Other):    cyclobenzaprine (FLEXERIL) 5 MG tablet, Take 1 tablet (5 mg total) by mouth 3 (three) times daily as needed for muscle spasms.   diazepam (VALIUM) 5 MG tablet, Take 1-2 tablet(s) by mouth 30 minutes prior to MRI.   gabapentin (NEURONTIN) 100 MG capsule, Take 2 capsules (200 mg total) by mouth at bedtime.   ondansetron (ZOFRAN-ODT) 4  MG disintegrating tablet, Place 1 tablet under tongue every 8 hours as needed, for nausea/vomiting.   Vitamin D, Ergocalciferol, (DRISDOL) 1.25 MG (50000 UNIT) CAPS capsule, Take 1 capsule (50,000 Units total) by mouth every 7 (seven) days.   Reviewed prior external information including notes and imaging from  primary care provider As well as notes that were available from care everywhere and other healthcare systems.  Past medical history, social, surgical and family history all reviewed in electronic medical record.  No pertanent information unless stated regarding to the chief complaint.   Review of Systems:  No headache, visual changes, nausea, vomiting, diarrhea, constipation, dizziness, abdominal pain, skin rash, fevers, chills, night sweats, weight loss, swollen lymph nodes, body aches, joint swelling, chest pain, shortness of breath, mood changes. POSITIVE muscle aches  Objective  There were no vitals taken for this visit.   General: No apparent distress alert and oriented x3 mood and affect normal, dressed appropriately.  HEENT: Pupils equal, extraocular movements intact  Respiratory: Patient's speak in full sentences and does not appear short of breath  Cardiovascular: No lower extremity edema, non tender, no erythema  Right knee does have the postsurgical changes still noted.  Trace effusion noted of the knee.  Severe tenderness to palpation over the pes anserine area.  Patient has worsening pain with flexion and extension more on the medial aspect of the knee.  Procedure: Real-time Ultrasound Guided Injection of right Pes anserine Device: GE Logiq Q7 Ultrasound guided injection is preferred based studies that show increased duration, increased effect, greater accuracy, decreased procedural pain, increased response rate, and decreased cost with ultrasound guided versus blind injection.  Verbal informed consent obtained.  Time-out conducted.  Noted no overlying erythema,  induration, or other signs of local infection.  Skin prepped in a sterile fashion.  Local anesthesia: Topical Ethyl chloride.  With sterile technique and under real time ultrasound guidance: With a 25-gauge half inch needle injected with 0.5 cc of 0.5% Marcaine and 0.5 cc of Kenalog 40 mg/mL Completed without difficulty  Pain immediately resolved suggesting accurate placement of the medication.  Advised to call if fevers/chills, erythema, induration, drainage, or persistent bleeding.  Impression: Technically successful ultrasound guided injection.    Impression and Recommendations:     The above documentation has been reviewed and is accurate and complete Judi Saa, DO

## 2022-08-01 ENCOUNTER — Ambulatory Visit: Payer: Commercial Managed Care - PPO | Admitting: Family Medicine

## 2022-08-07 ENCOUNTER — Ambulatory Visit: Payer: Commercial Managed Care - PPO | Admitting: Family Medicine

## 2022-08-10 NOTE — Progress Notes (Signed)
[name] [address] [place] [address] Phone: [phone] Subjective:   Cody [name], am serving as a scribe for Dr. Antoine Primas.  I'm seeing this patient by the request  of:  [name], [name], MD  CC: right knee pain   [id number]  07/17/2022 Patient does have more of a pes anserine bursitis that could be reactive from the surgery.  Could be the compensation as well.  Hopefully patient responds well to the injection.  We discussed continuing to follow-up with his orthopedic surgeon as well.  May need formal physical therapy again.  Increase activity slowly.  Follow-up again in 6 to 8 weeks      Update 08/14/2022 Cody [name] is a 57 y.o. male coming in with complaint of R knee pain. Injection last visit. Patient states same place. No change. No treatment is helping. Recently had an injection from surgeon.   Saw Dr. Aundria Rud 6/19    Past Medical History:  Diagnosis Date   Claustrophobia    Complication of anesthesia    dizzy, difficult time waking up   Hemorrhoid 01/2019   external   Medical history non-contributory    Past Surgical History:  Procedure Laterality Date   HERNIA REPAIR     umbicial   LAPAROSCOPIC CHOLECYSTECTOMY     NECK SURGERY     as a child- patient is not sure what itwas- had cat scratch fever   RADIOLOGY WITH ANESTHESIA N/A 01/13/2016   Procedure: MRI of brain with and without and cervical spine and lumbar spine;  Surgeon: Medication Radiologist, MD;  Location: [facility];  Service: Radiology;  Laterality: N/A;   RADIOLOGY WITH ANESTHESIA N/A 01/21/2019   Procedure: MRI WITH ANESTHESIA CERVICAL SPINE WITHOUT IV CONTRAST;  Surgeon: Radiologist, Medication, MD;  Location: [facility];  Service: Radiology;  Laterality: N/A;   RADIOLOGY WITH ANESTHESIA N/A 09/20/2021   Procedure: MRI WITH ANESTHESIA LUMBAR SPINE WITHOUT CONTRAST;  Surgeon: Radiologist, Medication, MD;  Location: [facility];  Service: Radiology;  Laterality: N/A;    Social History   Socioeconomic History   Marital status: Married    Spouse name: Not on file   Number of children: Not on file   Years of education: Not on file   Highest education level: Not on file  Occupational History   Not on file  Tobacco Use   Smoking status: Never   Smokeless tobacco: Never  Vaping Use   Vaping Use: Never used  Substance and Sexual Activity   Alcohol use: Yes    Comment: occassional   Drug use: No   Sexual activity: Yes  Other Topics Concern   Not on file  Social History Narrative   Not on file   Social Determinants of Health   Financial Resource Strain: Not on file  Food Insecurity: Not on file  Transportation Needs: Not on file  Physical Activity: Not on file  Stress: Not on file  Social Connections: Not on file   No Known Allergies Family History  Adopted: Yes  Problem Relation Age of Onset   Colon polyps Father    Cancer Paternal Uncle        colon at 70.   Colon cancer Paternal Uncle    Esophageal cancer Neg Hx    Rectal cancer Neg Hx    Stomach cancer Neg Hx     Current Outpatient Medications (Endocrine & Metabolic):    predniSONE (DELTASONE) 20 MG tablet, Take 2 tablets (40 mg  total) by mouth daily with breakfast.   Current Outpatient Medications (Respiratory):    fluticasone (FLONASE) 50 MCG/ACT nasal spray, Place 2 sprays into both nostrils daily.   HYDROcodone bit-homatropine (HYCODAN) 5-1.5 MG/5ML syrup, Take 5 mLs by mouth every 8 (eight) hours as needed for cough.  Current Outpatient Medications (Analgesics):    HYDROcodone-acetaminophen (NORCO/VICODIN) 5-325 MG tablet, Take 1 tablet every 6 hours by oral route as needed, for moderate to severe pain.   Current Outpatient Medications (Other):    gabapentin (NEURONTIN) 100 MG capsule, Take 2 capsules (200 mg total) by mouth 2 (two) times daily.   cyclobenzaprine (FLEXERIL) 5 MG tablet, Take 1 tablet (5 mg total) by mouth 3 (three) times daily as needed for muscle  spasms.   diazepam (VALIUM) 5 MG tablet, Take 1-2 tablet(s) by mouth 30 minutes prior to MRI.   ondansetron (ZOFRAN-ODT) 4 MG disintegrating tablet, Place 1 tablet under tongue every 8 hours as needed, for nausea/vomiting.   Vitamin D, Ergocalciferol, (DRISDOL) 1.25 MG (50000 UNIT) CAPS capsule, Take 1 capsule (50,000 Units total) by mouth every 7 (seven) days.   Reviewed prior external information including notes and imaging from  primary care provider As well as notes that were available from care everywhere and other healthcare systems.  Past medical history, social, surgical and family history all reviewed in electronic medical record.  No pertanent information unless stated regarding to the chief complaint.   Review of Systems:  No headache, visual changes, nausea, vomiting, diarrhea, constipation, dizziness, abdominal pain, skin rash, fevers, chills, night sweats, weight loss, swollen lymph nodes, body aches, joint swelling, chest pain, shortness of breath, mood changes. POSITIVE muscle aches  Objective  Blood pressure 108/78, pulse (!) 56, height 5\' 6"  (1.676 m), SpO2 98 %.   General: No apparent distress alert and oriented x3 mood and affect normal, dressed appropriately.  HEENT: Pupils equal, extraocular movements intact  Respiratory: Patient's speak in full sentences and does not appear short of breath  Cardiovascular: No lower extremity edema, non tender, no erythema  Right knee exam shows Tender to palpation still in the Pez anserine area in the proximal tibia.  Mild over the medial joint line but very minimal.  Limited muscular skeletal ultrasound was performed and interpreted by Antoine Primas, M  Limited ultrasound does not show any significant cortical irregularity noted of the tibia.  Significant decrease in the hypoechoic changes that was consistent with the bursitis previously.    Impression and Recommendations:     The above documentation has been reviewed and is  accurate and complete Judi Saa, DO

## 2022-08-14 ENCOUNTER — Other Ambulatory Visit (HOSPITAL_BASED_OUTPATIENT_CLINIC_OR_DEPARTMENT_OTHER): Payer: Self-pay

## 2022-08-14 ENCOUNTER — Ambulatory Visit: Payer: Commercial Managed Care - PPO | Admitting: Family Medicine

## 2022-08-14 ENCOUNTER — Encounter: Payer: Self-pay | Admitting: Family Medicine

## 2022-08-14 ENCOUNTER — Other Ambulatory Visit: Payer: Self-pay

## 2022-08-14 VITALS — BP 108/78 | HR 56 | Ht 66.0 in

## 2022-08-14 DIAGNOSIS — M25561 Pain in right knee: Secondary | ICD-10-CM

## 2022-08-14 DIAGNOSIS — M5441 Lumbago with sciatica, right side: Secondary | ICD-10-CM

## 2022-08-14 DIAGNOSIS — G8929 Other chronic pain: Secondary | ICD-10-CM

## 2022-08-14 DIAGNOSIS — M7050 Other bursitis of knee, unspecified knee: Secondary | ICD-10-CM

## 2022-08-14 MED ORDER — GABAPENTIN 100 MG PO CAPS
200.0000 mg | ORAL_CAPSULE | Freq: Two times a day (BID) | ORAL | 0 refills | Status: DC
  Filled 2022-08-14 – 2022-08-28 (×2): qty 360, 90d supply, fill #0

## 2022-08-14 NOTE — Assessment & Plan Note (Signed)
Continues to have pain in the area but I do think that there is a possibility of this could be more of a lumbar radiculopathy.  We discussed with patient about the possibility of attempting another epidural which patient declined at the moment.  Patient is open to try formal physical therapy.  Referred patient today.  Hopeful that this will be beneficial. Is going away on vacation for some time and then will come back and then consider starting the therapy.  Discussed icing regimen and home exercises otherwise.  Discussed what medications could be beneficial.  Follow-up with me again in 6 weeks.

## 2022-08-14 NOTE — Assessment & Plan Note (Signed)
Increase gabapentin to 300 mg at night patient does not want to try any other medications such as the Effexor or the Cymbalta.

## 2022-08-14 NOTE — Patient Instructions (Signed)
Gabapentin 200mg  2x a day Thigh compression sleeve PT [facility] will call you See me again in 4 weeks

## 2022-08-27 ENCOUNTER — Encounter: Payer: Self-pay | Admitting: Family Medicine

## 2022-08-28 ENCOUNTER — Other Ambulatory Visit (HOSPITAL_BASED_OUTPATIENT_CLINIC_OR_DEPARTMENT_OTHER): Payer: Self-pay

## 2022-08-29 ENCOUNTER — Other Ambulatory Visit: Payer: Self-pay

## 2022-08-29 DIAGNOSIS — G8929 Other chronic pain: Secondary | ICD-10-CM

## 2022-08-30 ENCOUNTER — Other Ambulatory Visit: Payer: Self-pay

## 2022-08-30 DIAGNOSIS — G8929 Other chronic pain: Secondary | ICD-10-CM

## 2022-09-01 ENCOUNTER — Encounter: Payer: Self-pay | Admitting: Family Medicine

## 2022-09-04 ENCOUNTER — Telehealth: Payer: Self-pay | Admitting: Family Medicine

## 2022-09-04 MED ORDER — GABAPENTIN 300 MG PO CAPS: 300.0000 mg | ORAL_CAPSULE | Freq: Every day | ORAL | 1 refills | Status: DC

## 2022-09-04 NOTE — Telephone Encounter (Signed)
Received message from Cody [name]'s wife Cody [name] that he needs a modification of his Gabapentin dose while on vacation.  Gabapentin 300mg  at bedtime sent to [facility] [place].  Covering for Dr Katrinka Blazing.

## 2022-09-11 ENCOUNTER — Encounter: Payer: Self-pay | Admitting: Family Medicine

## 2022-09-11 ENCOUNTER — Other Ambulatory Visit: Payer: Self-pay

## 2022-09-11 DIAGNOSIS — S83221A Peripheral tear of medial meniscus, current injury, right knee, initial encounter: Secondary | ICD-10-CM | POA: Diagnosis not present

## 2022-09-11 MED ORDER — DEXAMETHASONE SODIUM PHOSPHATE 4 MG/ML IJ SOLN: 4.0000 mg | Freq: Once | INTRAMUSCULAR | 1 refills | Status: AC

## 2022-09-11 NOTE — Progress Notes (Unsigned)
[name] [address] [place] [address] Phone: [phone] Subjective:   Cody [name], am serving as a scribe for Dr. Antoine Primas.  I'm seeing this patient by the request  of:  Cody, [name], MD  CC: Right knee pain  VFI:[id number]  08/14/2022 Increase gabapentin to 300 mg at night patient does not want to try any other medications such as the Effexor or the Cymbalta.     Continues to have pain in the area but I do think that there is a possibility of this could be more of a lumbar radiculopathy.  We discussed with patient about the possibility of attempting another epidural which patient declined at the moment.  Patient is open to try formal physical therapy.  Referred patient today.  Hopeful that this will be beneficial. Is going away on vacation for some time and then will come back and then consider starting the therapy.  Discussed icing regimen and home exercises otherwise.  Discussed what medications could be beneficial.  Follow-up with me again in 6 weeks.  Updated 09/14/2022 Cody [name] is a 57 y.o. male coming in with complaint of back and knee pain. Patient states that he developed nerve pain over medial aspect of knee which was bad enough that he could not walk. Started PT on Monday. Saw surgeon today. Is suppose to do a nerve block in 2 nerves on medial aspect of knee.        Past Medical History:  Diagnosis Date   Claustrophobia    Complication of anesthesia    dizzy, difficult time waking up   Hemorrhoid 01/2019   external   Medical history non-contributory    Past Surgical History:  Procedure Laterality Date   HERNIA REPAIR     umbicial   LAPAROSCOPIC CHOLECYSTECTOMY     NECK SURGERY     as a child- patient is not sure what itwas- had cat scratch fever   RADIOLOGY WITH ANESTHESIA N/A 01/13/2016   Procedure: MRI of brain with and without and cervical spine and lumbar spine;  Surgeon: Medication Radiologist, MD;   Location: [facility] OR;  Service: Radiology;  Laterality: N/A;   RADIOLOGY WITH ANESTHESIA N/A 01/21/2019   Procedure: MRI WITH ANESTHESIA CERVICAL SPINE WITHOUT IV CONTRAST;  Surgeon: Radiologist, Medication, MD;  Location: [facility] OR;  Service: Radiology;  Laterality: N/A;   RADIOLOGY WITH ANESTHESIA N/A 09/20/2021   Procedure: MRI WITH ANESTHESIA LUMBAR SPINE WITHOUT CONTRAST;  Surgeon: Radiologist, Medication, MD;  Location: [facility] OR;  Service: Radiology;  Laterality: N/A;   Social History   Socioeconomic History   Marital status: Married    Spouse name: Not on file   Number of children: Not on file   Years of education: Not on file   Highest education level: Not on file  Occupational History   Not on file  Tobacco Use   Smoking status: Never   Smokeless tobacco: Never  Vaping Use   Vaping status: Never Used  Substance and Sexual Activity   Alcohol use: Yes    Comment: occassional   Drug use: No   Sexual activity: Yes  Other Topics Concern   Not on file  Social History Narrative   Not on file   Social Determinants of Health   Financial Resource Strain: Not on file  Food Insecurity: Not on file  Transportation Needs: Not on file  Physical Activity: Not on file  Stress: Not on file  Social Connections: Not on  file   No Known Allergies Family History  Adopted: Yes  Problem Relation Age of Onset   Colon polyps Father    Cancer Paternal Uncle        colon at 72.   Colon cancer Paternal Uncle    Esophageal cancer Neg Hx    Rectal cancer Neg Hx    Stomach cancer Neg Hx     Current Outpatient Medications (Endocrine & Metabolic):    predniSONE (DELTASONE) 20 MG tablet, Take 2 tablets (40 mg total) by mouth daily with breakfast.   Current Outpatient Medications (Respiratory):    fluticasone (FLONASE) 50 MCG/ACT nasal spray, Place 2 sprays into both nostrils daily.   HYDROcodone bit-homatropine (HYCODAN) 5-1.5 MG/5ML syrup, Take 5 mLs by mouth every 8 (eight) hours as needed for  cough.  Current Outpatient Medications (Analgesics):    HYDROcodone-acetaminophen (NORCO/VICODIN) 5-325 MG tablet, Take 1 tablet every 6 hours by oral route as needed, for moderate to severe pain.   Current Outpatient Medications (Other):    cyclobenzaprine (FLEXERIL) 5 MG tablet, Take 1 tablet (5 mg total) by mouth 3 (three) times daily as needed for muscle spasms.   diazepam (VALIUM) 5 MG tablet, Take 1-2 tablet(s) by mouth 30 minutes prior to MRI.   gabapentin (NEURONTIN) 300 MG capsule, Take 1 capsule (300 mg total) by mouth at bedtime.   gabapentin (NEURONTIN) 300 MG capsule, Take 1 capsule (300 mg total) by mouth at bedtime.   lidocaine (LIDODERM) 5 %, Place 1 patch onto the skin daily. May wear up to 12 hours.   ondansetron (ZOFRAN-ODT) 4 MG disintegrating tablet, Place 1 tablet under tongue every 8 hours as needed, for nausea/vomiting.   Vitamin D, Ergocalciferol, (DRISDOL) 1.25 MG (50000 UNIT) CAPS capsule, Take 1 capsule (50,000 Units total) by mouth every 7 (seven) days.   Reviewed prior external information including notes and imaging from  primary care provider As well as notes that were available from care everywhere and other healthcare systems.  Past medical history, social, surgical and family history all reviewed in electronic medical record.  No pertanent information unless stated regarding to the chief complaint.   Review of Systems:  No headache, visual changes, nausea, vomiting, diarrhea, constipation, dizziness, abdominal pain, skin rash, fevers, chills, night sweats, weight loss, swollen lymph nodes, body aches, joint swelling, chest pain, shortness of breath, mood changes. POSITIVE muscle aches out of proportion and intermittent  Objective  Blood pressure 112/78, pulse 65, height 5\' 6"  (1.676 m).   General: No apparent distress alert and oriented x3 mood and affect normal, dressed appropriately.  HEENT: Pupils equal, extraocular movements intact  Respiratory:  Patient's speak in full sentences and does not appear short of breath  Cardiovascular: No lower extremity edema, non tender, no erythema  Patient does have still tenderness over the medial aspect of the right knee and that is out of proportion.  No significant findings on palpation though otherwise.  No instability of the knee.  Patient is able to ambulate without any type of gait abnormality.   Limited muscular skeletal ultrasound was performed and interpreted by Antoine Primas, M  Limited ultrasound shows no abnormality, question mild baker cyst  Impression: No specific findings   Impression and Recommendations:     The above documentation has been reviewed and is accurate and complete Judi Saa, DO

## 2022-09-12 ENCOUNTER — Ambulatory Visit
Admission: RE | Admit: 2022-09-12 | Discharge: 2022-09-12 | Disposition: A | Discharge status: Home / Self Care | Payer: Commercial Managed Care - PPO | Source: Ambulatory Visit | Attending: Family Medicine | Admitting: Family Medicine

## 2022-09-12 ENCOUNTER — Ambulatory Visit: Admission: RE | Admit: 2022-09-12 | Payer: Commercial Managed Care - PPO | Source: Ambulatory Visit

## 2022-09-12 DIAGNOSIS — M25561 Pain in right knee: Secondary | ICD-10-CM | POA: Diagnosis not present

## 2022-09-12 DIAGNOSIS — G8929 Other chronic pain: Secondary | ICD-10-CM

## 2022-09-12 DIAGNOSIS — R600 Localized edema: Secondary | ICD-10-CM | POA: Diagnosis not present

## 2022-09-12 DIAGNOSIS — M25461 Effusion, right knee: Secondary | ICD-10-CM | POA: Diagnosis not present

## 2022-09-13 ENCOUNTER — Other Ambulatory Visit (HOSPITAL_BASED_OUTPATIENT_CLINIC_OR_DEPARTMENT_OTHER): Payer: Self-pay

## 2022-09-13 DIAGNOSIS — M792 Neuralgia and neuritis, unspecified: Secondary | ICD-10-CM | POA: Diagnosis not present

## 2022-09-13 DIAGNOSIS — M25561 Pain in right knee: Secondary | ICD-10-CM | POA: Diagnosis not present

## 2022-09-13 MED ORDER — LIDOCAINE 5 % EX PTCH
1.0000 | MEDICATED_PATCH | Freq: Every day | CUTANEOUS | 0 refills | Status: DC
  Filled 2022-09-13: qty 30, 30d supply, fill #0

## 2022-09-14 ENCOUNTER — Ambulatory Visit: Payer: Commercial Managed Care - PPO | Admitting: Family Medicine

## 2022-09-14 ENCOUNTER — Other Ambulatory Visit: Payer: Self-pay

## 2022-09-14 ENCOUNTER — Encounter: Payer: Self-pay | Admitting: Family Medicine

## 2022-09-14 ENCOUNTER — Other Ambulatory Visit (HOSPITAL_COMMUNITY): Payer: Self-pay

## 2022-09-14 VITALS — BP 112/78 | HR 65 | Ht 66.0 in

## 2022-09-14 DIAGNOSIS — M25561 Pain in right knee: Secondary | ICD-10-CM | POA: Diagnosis not present

## 2022-09-14 MED ORDER — GABAPENTIN 300 MG PO CAPS
300.0000 mg | ORAL_CAPSULE | Freq: Every day | ORAL | 0 refills | Status: DC
  Filled 2022-09-14 – 2022-10-01 (×2): qty 90, 90d supply, fill #0

## 2022-09-14 NOTE — Patient Instructions (Addendum)
Prescription filled.

## 2022-09-14 NOTE — Assessment & Plan Note (Signed)
At this juncture there is no more Pez bursitis and hamstring appears to be unremarkable.  Question of a very small Baker's cyst but not large enough to cause any discomfort.  Reviewed patient's CT scan that did not show any cortical irregularity that makes me concerned for an underlying nonhealing fracture.  Do not feel that an MRI would be beneficial at this time.  Discussed with patient about other treatments for her such as a chronic pain syndrome, reflex sympathetic dystrophy but patient does not have any neurovascular symptoms.  Feel that there is some underlying anxiety that could be potentially exacerbating it.  Patient still does not want to try any medications.  Patient would like to start with physical therapy and see how he does for 6 weeks and then is going to consider a geniculate block.  Patient knows we are here otherwise if you would like to try some of the treatments we discussed

## 2022-09-18 ENCOUNTER — Other Ambulatory Visit: Payer: Commercial Managed Care - PPO

## 2022-09-18 ENCOUNTER — Ambulatory Visit: Payer: 59 | Admitting: Neurology

## 2022-10-02 ENCOUNTER — Telehealth: Payer: Self-pay | Admitting: Family Medicine

## 2022-10-02 ENCOUNTER — Other Ambulatory Visit (HOSPITAL_BASED_OUTPATIENT_CLINIC_OR_DEPARTMENT_OTHER): Payer: Self-pay

## 2022-10-02 NOTE — Telephone Encounter (Signed)
[place] Parker, physical therapist at [facility] called stating that he has been seeing Sharl Ma recently for his right knee.  He is schedule for an MRI on Wednesday. [place] said that Sharl Ma has expressed frustration that no one is able to find out what is going on and why he is having so much pain. He has shown a lack of energy and not wanting to do things due to the pain along with signs of depression because of this. He wanted Dr Katrinka Blazing to be aware in hopes of some clear answers and future plan after the MRI.  [place] can be reached at [phone].

## 2022-10-04 DIAGNOSIS — M25561 Pain in right knee: Secondary | ICD-10-CM | POA: Diagnosis not present

## 2022-10-05 NOTE — Telephone Encounter (Signed)
I attempted to call therapist 2 times to discuss with them.  This individual has shown signs with me as well but has been very noncompliant about changing medical necessity and medications.  Do feel that something such as Cymbalta would be significantly beneficial for him.  If therapist does call back and relay this information if needed.

## 2022-10-09 DIAGNOSIS — S83221A Peripheral tear of medial meniscus, current injury, right knee, initial encounter: Secondary | ICD-10-CM | POA: Diagnosis not present

## 2022-10-12 DIAGNOSIS — S83221A Peripheral tear of medial meniscus, current injury, right knee, initial encounter: Secondary | ICD-10-CM | POA: Diagnosis not present

## 2022-10-17 DIAGNOSIS — S83221A Peripheral tear of medial meniscus, current injury, right knee, initial encounter: Secondary | ICD-10-CM | POA: Diagnosis not present

## 2022-11-13 ENCOUNTER — Encounter: Payer: Self-pay | Admitting: Physician Assistant

## 2022-11-14 ENCOUNTER — Ambulatory Visit: Payer: Commercial Managed Care - PPO | Admitting: Neurology

## 2022-11-15 ENCOUNTER — Telehealth: Payer: Commercial Managed Care - PPO | Admitting: Physician Assistant

## 2022-11-15 ENCOUNTER — Other Ambulatory Visit (HOSPITAL_BASED_OUTPATIENT_CLINIC_OR_DEPARTMENT_OTHER): Payer: Self-pay

## 2022-11-15 DIAGNOSIS — M549 Dorsalgia, unspecified: Secondary | ICD-10-CM

## 2022-11-15 MED ORDER — PREDNISONE 20 MG PO TABS
40.0000 mg | ORAL_TABLET | Freq: Every day | ORAL | 0 refills | Status: DC
Start: 2022-11-15 — End: 2022-11-17
  Filled 2022-11-15: qty 10, 5d supply, fill #0

## 2022-11-15 MED ORDER — CYCLOBENZAPRINE HCL 5 MG PO TABS
5.0000 mg | ORAL_TABLET | Freq: Three times a day (TID) | ORAL | 0 refills | Status: DC | PRN
  Filled 2022-11-15: qty 30, 10d supply, fill #0

## 2022-11-15 NOTE — Progress Notes (Signed)
I have spent 5 minutes in review of e-visit questionnaire, review and updating patient chart, medical decision making and response to patient.   Mia Milan Cody Jacklynn Dehaas, PA-C    

## 2022-11-15 NOTE — Progress Notes (Signed)
ACUTE VISIT Chief Complaint  Patient presents with   Rectal Bleeding    Ongoing    HPI: [name] is a 57 y.o. male with a PMHx significant for neck pain and pes anserine bursitis, who is here today complaining of intermittent episodes of rectal bleeding.    Last seen on 11/29/2020.  He states he has had rectal bleeding for his whole life. He says he goes to the bathroom 6x/day, always has diarrhea, and has to go every time he eats.   Rectal Bleeding  The current episode started more than 1 week ago. The onset is undetermined. The problem has been unchanged. The stool is described as liquid. There was no prior successful therapy. Prior unsuccessful therapies include diet changes. Associated symptoms include diarrhea and rectal pain. Pertinent negatives include no anorexia, no fever, no abdominal pain, no hematemesis, no nausea, no vomiting, no hematuria, no chest pain, no headaches, no coughing, no difficulty breathing and no rash.   He notes avoiding carbohydrates in his diet helps with his symptoms. He is requesting referral to immunologist to evaluate for possible food allergies.  He denies worsening symptoms with stress.   He recently noticed very bright red blood on toilet paper after defecation.  His wife, Dr Lawerance Bach, performed an examination, he says she noticed some swelling. He notes on his own examination a tender spot with palpation but denies dyschezia.  He has been using Anusol suppositories. He endorses some pain upon insertion but notes that it has helped some with pain, reoccurs 2 weeks after stopping treatment.  He denies blood mixed with the stool, melena, gross hematuria, polyuria, dysuria, abdominal pain, nausea, or vomiting.  He is up to date on his colonoscopy with Dr. Rhea Belton.  He notes his paternal uncle died of colon cancer at 69.  Fhx negative for IBD.  He notes he has had a cholecystectomy about 10 years ago, when GI symptoms where already  present. He states he doesn't smoke and rarely drinks.  Lab Results  Component Value Date   NA 139 09/13/2021   CL 103 09/13/2021   K 4.2 09/13/2021   CO2 27 09/13/2021   BUN 21 09/13/2021   CREATININE 1.04 09/13/2021   GFR 81.63 09/13/2021   CALCIUM 9.3 09/13/2021   CALCIUM 9.6 09/13/2021   ALBUMIN 4.5 09/13/2021   GLUCOSE 97 09/13/2021   Lab Results  Component Value Date   WBC 5.4 09/13/2021   HGB 14.4 09/13/2021   HCT 41.6 09/13/2021   MCV 88.6 09/13/2021   PLT 216.0 09/13/2021   He mentions that since his last visit he had a meniscus surgery in 03/2022. He notes he has put on 20 lbs since the surgery because he hasn't been able to walk for exercise due to pain.   Review of Systems  Constitutional:  Negative for fever.  HENT:  Negative for nosebleeds and sore throat.   Respiratory:  Negative for cough.   Cardiovascular:  Negative for chest pain.  Gastrointestinal:  Positive for diarrhea, hematochezia and rectal pain. Negative for abdominal pain, anorexia, hematemesis, nausea and vomiting.  Endocrine: Negative for cold intolerance and heat intolerance.  Genitourinary:  Negative for decreased urine volume, dysuria and hematuria.  Skin:  Negative for rash.  Neurological:  Negative for syncope, weakness and headaches.  Hematological:  Negative for adenopathy. Does not bruise/bleed easily.  See other pertinent positives and negatives in HPI.  Current Outpatient Medications on File Prior to Visit  Medication Sig Dispense Refill  cyclobenzaprine (FLEXERIL) 5 MG tablet Take 1 tablet (5 mg total) by mouth 3 (three) times daily as needed for muscle spasms. 30 tablet 0   fluticasone (FLONASE) 50 MCG/ACT nasal spray Place 2 sprays into both nostrils daily. 16 g 6   gabapentin (NEURONTIN) 300 MG capsule Take 1 capsule (300 mg total) by mouth at bedtime. 90 capsule 1   No current facility-administered medications on file prior to visit.    Past Medical History:  Diagnosis Date    Claustrophobia    Complication of anesthesia    dizzy, difficult time waking up   Hemorrhoid 01/2019   external   Medical history non-contributory    No Known Allergies  Social History   Socioeconomic History   Marital status: Married    Spouse name: Not on file   Number of children: Not on file   Years of education: Not on file   Highest education level: Not on file  Occupational History   Not on file  Tobacco Use   Smoking status: Never   Smokeless tobacco: Never  Vaping Use   Vaping status: Never Used  Substance and Sexual Activity   Alcohol use: Yes    Comment: occassional   Drug use: No   Sexual activity: Yes  Other Topics Concern   Not on file  Social History Narrative   Not on file   Social Determinants of Health   Financial Resource Strain: Not on file  Food Insecurity: Not on file  Transportation Needs: Not on file  Physical Activity: Not on file  Stress: Not on file  Social Connections: Not on file   Vitals:   11/17/22 0656  BP: 120/70  Pulse: 94  Resp: 12  Temp: 98.1 F (36.7 C)  SpO2: 95%   Body mass index is 35.51 kg/m.  Physical Exam Vitals and nursing note reviewed. Exam conducted with a chaperone present.  Constitutional:      General: He is not in acute distress.    Appearance: He is well-developed.  HENT:     Head: Normocephalic and atraumatic.     Mouth/Throat:     Mouth: Mucous membranes are moist.     Pharynx: Oropharynx is clear.  Eyes:     Conjunctiva/sclera: Conjunctivae normal.  Cardiovascular:     Rate and Rhythm: Normal rate and regular rhythm.     Heart sounds: No murmur heard. Pulmonary:     Effort: Pulmonary effort is normal. No respiratory distress.     Breath sounds: Normal breath sounds.  Abdominal:     Palpations: Abdomen is soft. There is no hepatomegaly or mass.     Tenderness: There is no abdominal tenderness.  Genitourinary:    Rectum: Tenderness, anal fissure and external hemorrhoid present. No  mass. Normal anal tone.     Comments: Lying on left side: Small perianal tear noted between 5-6 O'clock. Tender, no active bleeding. Hemorrhoid noted in anal canal around same area. Musculoskeletal:     Right lower leg: No edema.     Left lower leg: No edema.  Lymphadenopathy:     Cervical: No cervical adenopathy.     Upper Body:     Right upper body: No supraclavicular adenopathy.     Left upper body: No supraclavicular adenopathy.  Skin:    General: Skin is warm.     Findings: No erythema or rash.  Neurological:     General: No focal deficit present.     Mental Status: He is alert and  oriented to person, place, and time.     Gait: Gait normal.  Psychiatric:        Mood and Affect: Affect normal. Mood is anxious.   ASSESSMENT AND PLAN:  Mr. Fetch was seen today for rectal bleeding.   Orders Placed This Encounter  Procedures   CBC   Comprehensive metabolic panel   TSH   C-reactive protein   Hepatitis C antibody   Sedimentation rate   Ambulatory referral to Immunology   Anal fissure We discussed diagnosis, prognosis, and treatment options. Recommend topical diltiazem for 4 to 6 weeks. It is important to avoid irritation, dietary changes to prevent diarrhea will help. Education provided on AVS.  -     diltiazem; Apply 1 Application topically 2 (two) times daily.  Dispense: 60 g; Refill: 1  Chronic diarrhea Assessment & Plan: We discussed possible etiologies. Last colonoscopy on 03/13/2019, polypectomy x 3.  According to patient, 7 years follow up recommended. ?  IBS. He agrees with trying amitriptyline 10 mg at bedtime, we discussed some side effects. He has already identified some food that trigger symptoms,so recommend avoiding them.  He is interested in food allergy evaluation, immunology referral placed as requested. He has an appointment with GI in 02/2023.  Orders: -     Comprehensive metabolic panel; Future -     TSH; Future -     C-reactive protein;  Future -     Sedimentation rate; Future -     Ambulatory referral to Immunology -     Amitriptyline HCl; Take 1 tablet (10 mg total) by mouth at bedtime.  Dispense: 30 tablet; Refill: 2  Rectal bleeding Assessment & Plan: We discussed possible causes, internal hemorrhoid were seen on his last colonoscopy and anal fissure seen on examination today. No blood in rectum appreciated during examination today. Instructed about warning signs. Has GI appt in 02/2023. Further recommendation will be given according to lab results.  Orders: -     CBC; Future -     Comprehensive metabolic panel; Future  Encounter for HCV screening test for low risk patient -     Hepatitis C antibody; Future   Return if symptoms worsen or fail to improve.  I, Rolla Etienne Wierda, acting as a scribe for Kendra Grissett [name], MD., have documented all relevant documentation on the behalf of Deedee Lybarger [name], MD, as directed by  Mckinnon Glick [name], MD while in the presence of Tama Grosz [name], MD.   I, Shylah Dossantos [name], MD, have reviewed all documentation for this visit. The documentation on 11/17/22 for the exam, diagnosis, procedures, and orders are all accurate and complete.  Dusten Ellinwood G. [name], MD  [facility]. [place] office.

## 2022-11-15 NOTE — Progress Notes (Signed)
We are sorry that you are not feeling well.  Here is how we plan to help!  Based on what you have shared with me it looks like you mostly have acute back pain.  Acute back pain is defined as musculoskeletal pain that can resolve in 1-3 weeks with conservative treatment.  I have prescribed a 5-day burst of prednisone and a course of cyclobenzaprine to use as directed. Some patients experience stomach irritation or in increased heartburn with anti-inflammatory drugs.  Please keep in mind that muscle relaxer's can cause fatigue and should not be taken while at work or driving.  Back pain is very common.  The pain often gets better over time.  The cause of back pain is usually not dangerous.  Most people can learn to manage their back pain on their own.  Home Care Stay active.  Start with short walks on flat ground if you can.  Try to walk farther each day. Do not sit, drive or stand in one place for more than 30 minutes.  Do not stay in bed. Do not avoid exercise or work.  Activity can help your back heal faster. Be careful when you bend or lift an object.  Bend at your knees, keep the object close to you, and do not twist. Sleep on a firm mattress.  Lie on your side, and bend your knees.  If you lie on your back, put a pillow under your knees. Only take medicines as told by your doctor. Put ice on the injured area. Put ice in a plastic bag Place a towel between your skin and the bag Leave the ice on for 15-20 minutes, 3-4 times a day for the first 2-3 days. 210 After that, you can switch between ice and heat packs. Ask your doctor about back exercises or massage. Avoid feeling anxious or stressed.  Find good ways to deal with stress, such as exercise.  Get Help Right Way If: Your pain does not go away with rest or medicine. Your pain does not go away in 1 week. You have new problems. You do not feel well. The pain spreads into your legs. You cannot control when you poop (bowel movement) or  pee (urinate) You feel sick to your stomach (nauseous) or throw up (vomit) You have belly (abdominal) pain. You feel like you may pass out (faint). If you develop a fever.  Make Sure you: Understand these instructions. Will watch your condition Will get help right away if you are not doing well or get worse.  Your e-visit answers were reviewed by a board certified advanced clinical practitioner to complete your personal care plan.  Depending on the condition, your plan could have included both over the counter or prescription medications.  If there is a problem please reply  once you have received a response from your provider.  Your safety is important to [place].  If you have drug allergies check your prescription carefully.    You can use MyChart to ask questions about today's visit, request a non-urgent call back, or ask for a work or school excuse for 24 hours related to this e-Visit. If it has been greater than 24 hours you will need to follow up with your provider, or enter a new e-Visit to address those concerns.  You will get an e-mail in the next two days asking about your experience.  I hope that your e-visit has been valuable and will speed your recovery. Thank you for using e-visits.

## 2022-11-17 ENCOUNTER — Ambulatory Visit: Payer: Commercial Managed Care - PPO | Admitting: Family Medicine

## 2022-11-17 ENCOUNTER — Other Ambulatory Visit: Payer: Self-pay

## 2022-11-17 ENCOUNTER — Encounter: Payer: Self-pay | Admitting: Family Medicine

## 2022-11-17 ENCOUNTER — Other Ambulatory Visit (HOSPITAL_BASED_OUTPATIENT_CLINIC_OR_DEPARTMENT_OTHER): Payer: Self-pay

## 2022-11-17 VITALS — BP 120/70 | HR 94 | Temp 98.1°F | Resp 12 | Ht 66.0 in | Wt 220.0 lb

## 2022-11-17 DIAGNOSIS — K529 Noninfective gastroenteritis and colitis, unspecified: Secondary | ICD-10-CM | POA: Diagnosis not present

## 2022-11-17 DIAGNOSIS — K602 Anal fissure, unspecified: Secondary | ICD-10-CM

## 2022-11-17 DIAGNOSIS — Z1159 Encounter for screening for other viral diseases: Secondary | ICD-10-CM | POA: Diagnosis not present

## 2022-11-17 DIAGNOSIS — K625 Hemorrhage of anus and rectum: Secondary | ICD-10-CM | POA: Insufficient documentation

## 2022-11-17 MED ORDER — DILTIAZEM GEL 2 %
1.0000 | Freq: Two times a day (BID) | CUTANEOUS | 1 refills | Status: DC
Start: 2022-11-17 — End: 2023-03-20
  Filled 2022-11-17: qty 60, 30d supply, fill #0

## 2022-11-17 MED ORDER — DILTIAZEM GEL 2 %
1.0000 | Freq: Two times a day (BID) | CUTANEOUS | 1 refills | Status: DC
  Filled 2022-11-17: qty 60, 30d supply, fill #0

## 2022-11-17 MED ORDER — AMITRIPTYLINE HCL 10 MG PO TABS
10.0000 mg | ORAL_TABLET | Freq: Every day | ORAL | 2 refills | Status: DC
Start: 2022-11-17 — End: 2022-12-07
  Filled 2022-11-17: qty 30, 30d supply, fill #0

## 2022-11-17 MED ORDER — AMITRIPTYLINE HCL 10 MG PO TABS
10.0000 mg | ORAL_TABLET | Freq: Every day | ORAL | 2 refills | Status: DC
  Filled 2022-11-17: qty 30, 30d supply, fill #0

## 2022-11-17 NOTE — Patient Instructions (Addendum)
A few things to remember from today's visit:  Anal fissure - Plan: diltiazem 2 % GEL  Chronic diarrhea - Plan: amitriptyline (ELAVIL) 10 MG tablet, Comprehensive metabolic panel, TSH, C-reactive protein, Sedimentation rate  Rectal bleeding - Plan: CBC, Comprehensive metabolic panel  Encounter for HCV screening test for low risk patient - Plan: Hepatitis C antibody Start taking amitriptyline daily at bedtime to help with diarrhea. Cream to be applied twice daily for 6 weeks.  If you need refills for medications you take chronically, please call your pharmacy. Do not use My Chart to request refills or for acute issues that need immediate attention. If you send a my chart message, it may take a few days to be addressed, specially if I am not in the office.  Please be sure medication list is accurate. If a new problem present, please set up appointment sooner than planned today.

## 2022-11-17 NOTE — Assessment & Plan Note (Signed)
We discussed possible causes, internal hemorrhoid were seen on his last colonoscopy and anal fissure seen on examination today. No blood in rectum appreciated during examination today. Instructed about warning signs. Has GI appt in 02/2023. Further recommendation will be given according to lab results.

## 2022-11-17 NOTE — Assessment & Plan Note (Addendum)
We discussed possible etiologies. Last colonoscopy on 03/13/2019, polypectomy x 3.  According to patient, 7 years follow up recommended. ?  IBS. He agrees with trying amitriptyline 10 mg at bedtime, we discussed some side effects. He has already identified some food that trigger symptoms,so recommend avoiding them.  He is interested in food allergy evaluation, immunology referral placed as requested. He has an appointment with GI in 02/2023.

## 2022-11-20 ENCOUNTER — Other Ambulatory Visit (INDEPENDENT_AMBULATORY_CARE_PROVIDER_SITE_OTHER): Payer: Commercial Managed Care - PPO

## 2022-11-20 ENCOUNTER — Other Ambulatory Visit (HOSPITAL_BASED_OUTPATIENT_CLINIC_OR_DEPARTMENT_OTHER): Payer: Self-pay

## 2022-11-20 DIAGNOSIS — K529 Noninfective gastroenteritis and colitis, unspecified: Secondary | ICD-10-CM | POA: Diagnosis not present

## 2022-11-20 DIAGNOSIS — K625 Hemorrhage of anus and rectum: Secondary | ICD-10-CM

## 2022-11-20 DIAGNOSIS — Z1159 Encounter for screening for other viral diseases: Secondary | ICD-10-CM

## 2022-11-20 LAB — COMPREHENSIVE METABOLIC PANEL
ALT: 23 U/L (ref 0–53)
AST: 18 U/L (ref 0–37)
Albumin: 4.3 g/dL (ref 3.5–5.2)
Alkaline Phosphatase: 55 U/L (ref 39–117)
BUN: 17 mg/dL (ref 6–23)
CO2: 26 meq/L (ref 19–32)
Calcium: 9.5 mg/dL (ref 8.4–10.5)
Chloride: 103 meq/L (ref 96–112)
Creatinine, Ser: 0.96 mg/dL (ref 0.40–1.50)
GFR: 89.12 mL/min (ref >60.00)
Glucose, Bld: 94 mg/dL (ref 70–99)
Potassium: 3.8 meq/L (ref 3.5–5.1)
Sodium: 138 meq/L (ref 135–145)
Total Bilirubin: 0.9 mg/dL (ref 0.2–1.2)
Total Protein: 7.2 g/dL (ref 6.0–8.3)

## 2022-11-20 LAB — CBC
HCT: 43.6 % (ref 39.0–52.0)
Hemoglobin: 14.8 g/dL (ref 13.0–17.0)
MCHC: 33.9 g/dL (ref 30.0–36.0)
MCV: 89.6 fL (ref 78.0–100.0)
Platelets: 230 10*3/uL (ref 150.0–400.0)
RBC: 4.87 Mil/uL (ref 4.22–5.81)
RDW: 12.6 % (ref 11.5–15.5)
WBC: 5.8 10*3/uL (ref 4.0–10.5)

## 2022-11-20 LAB — TSH: TSH: 1.46 u[IU]/mL (ref 0.35–5.50)

## 2022-11-20 LAB — SEDIMENTATION RATE: Sed Rate: 9 mm/h (ref 0–20)

## 2022-11-20 LAB — C-REACTIVE PROTEIN: CRP: <1 mg/dL (ref 0.5–20.0)

## 2022-11-21 DIAGNOSIS — Z9889 Other specified postprocedural states: Secondary | ICD-10-CM | POA: Diagnosis not present

## 2022-11-23 ENCOUNTER — Other Ambulatory Visit (HOSPITAL_BASED_OUTPATIENT_CLINIC_OR_DEPARTMENT_OTHER): Payer: Self-pay

## 2022-11-23 ENCOUNTER — Encounter: Payer: Self-pay | Admitting: Family Medicine

## 2022-11-23 ENCOUNTER — Ambulatory Visit: Payer: Commercial Managed Care - PPO | Admitting: Family Medicine

## 2022-11-23 VITALS — BP 112/82 | HR 76 | Ht 66.0 in

## 2022-11-23 DIAGNOSIS — S76311D Strain of muscle, fascia and tendon of the posterior muscle group at thigh level, right thigh, subsequent encounter: Secondary | ICD-10-CM | POA: Diagnosis not present

## 2022-11-23 MED ORDER — CELECOXIB 100 MG PO CAPS
100.0000 mg | ORAL_CAPSULE | Freq: Two times a day (BID) | ORAL | 2 refills | Status: DC
  Filled 2022-11-23: qty 120, 60d supply, fill #0

## 2022-11-23 NOTE — Progress Notes (Signed)
[name] [address] [place] [address] Phone: [phone] Subjective:   Cody [name], am serving as a scribe for Dr. Antoine Primas.  I'm seeing this patient by the request  of:  Cody [name], Cody G, MD  CC: Right knee pain follow-up  ONG:[id number]  Cody [name] is a 57 y.o. male coming in with complaint of still terrible. Nerve block didn't help at all. Wants other options.  Patient was seen by an outside provider on the eighth, which was 2 days ago, who once again had a another MRI that did not show any acute appearing intra-articular issue of the knee.  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  Patient is scheduled for an adductor canal injection around the saphenous nerve   Patient has had significant amount of interventions including the Pez anserine injection in June that did not make any significant improvement.  Patient continues to have pain otherwise  Past Medical History:  Diagnosis Date   Claustrophobia    Complication of anesthesia    dizzy, difficult time waking up   Hemorrhoid 01/2019   external   Medical history non-contributory    Past Surgical History:  Procedure Laterality Date   HERNIA REPAIR     umbicial   LAPAROSCOPIC CHOLECYSTECTOMY     NECK SURGERY     as a child- patient is not sure what itwas- had cat scratch fever   RADIOLOGY WITH ANESTHESIA N/A 01/13/2016   Procedure: MRI of brain with and without and cervical spine and lumbar spine;  Surgeon: Medication Radiologist, MD;  Location: [facility] OR;  Service: Radiology;  Laterality: N/A;   RADIOLOGY WITH ANESTHESIA N/A 01/21/2019   Procedure: MRI WITH ANESTHESIA CERVICAL SPINE WITHOUT IV CONTRAST;  Surgeon: Radiologist, Medication, MD;  Location: [facility] OR;  Service: Radiology;  Laterality: N/A;   RADIOLOGY WITH ANESTHESIA N/A 09/20/2021   Procedure: MRI WITH ANESTHESIA LUMBAR SPINE WITHOUT CONTRAST;  Surgeon: Radiologist, Medication, MD;  Location: [facility]  OR;  Service: Radiology;  Laterality: N/A;   Social History   Socioeconomic History   Marital status: Married    Spouse name: Not on file   Number of children: Not on file   Years of education: Not on file   Highest education level: Not on file  Occupational History   Not on file  Tobacco Use   Smoking status: Never   Smokeless tobacco: Never  Vaping Use   Vaping status: Never Used  Substance and Sexual Activity   Alcohol use: Yes    Comment: occassional   Drug use: No   Sexual activity: Yes  Other Topics Concern   Not on file  Social History Narrative   Not on file   Social Determinants of Health   Financial Resource Strain: Not on file  Food Insecurity: Not on file  Transportation Needs: Not on file  Physical Activity: Not on file  Stress: Not on file  Social Connections: Not on file   No Known Allergies Family History  Adopted: Yes  Problem Relation Age of Onset   Colon polyps Father    Cancer Paternal Uncle        colon at 41.   Colon cancer Paternal Uncle    Esophageal cancer Neg Hx    Rectal cancer Neg Hx    Stomach cancer Neg Hx      Current Outpatient Medications (Cardiovascular):    diltiazem 2 % GEL*, Apply 1 Application topically 2 (two) times  daily.  Current Outpatient Medications (Respiratory):    fluticasone (FLONASE) 50 MCG/ACT nasal spray, Place 2 sprays into both nostrils daily.  Current Outpatient Medications (Analgesics):    celecoxib (CELEBREX) 100 MG capsule, Take 1 capsule (100 mg total) by mouth 2 (two) times daily.   Current Outpatient Medications (Other):    amitriptyline (ELAVIL) 10 MG tablet, Take 1 tablet (10 mg total) by mouth at bedtime.   cyclobenzaprine (FLEXERIL) 5 MG tablet, Take 1 tablet (5 mg total) by mouth 3 (three) times daily as needed for muscle spasms.   diltiazem 2 % GEL*, Apply 1 Application topically 2 (two) times daily.   gabapentin (NEURONTIN) 300 MG capsule, Take 1 capsule (300 mg total) by mouth at  bedtime. * These medications belong to multiple therapeutic classes and are listed under each applicable group.   Reviewed prior external information including notes and imaging from  primary care provider As well as notes that were available from care everywhere and other healthcare systems.  Past medical history, social, surgical and family history all reviewed in electronic medical record.  No pertanent information unless stated regarding to the chief complaint.   Review of Systems:  No headache, visual changes, nausea, vomiting, diarrhea, constipation, dizziness, abdominal pain, skin rash, fevers, chills, night sweats, weight loss, swollen lymph nodes, body aches, joint swelling, chest pain, shortness of breath, mood changes. POSITIVE muscle aches  Objective  Blood pressure 112/82, pulse 76, height 5\' 6"  (1.676 m), SpO2 96%.   General: No apparent distress alert and oriented x3 mood and affect normal, dressed appropriately.  HEENT: Pupils equal, extraocular movements intact  Respiratory: Patient's speak in full sentences and does not appear short of breath  Cardiovascular: No lower extremity edema, non tender, no erythema  Right knee does have tenderness to palpation still that is out of proportion to the amount of palpation on the medial aspect of the knee.  Does have some mild fat atrophy in this area from a previous injection and appears patient does not have good range of motion of the knee.  No significant instability noted.    Impression and Recommendations:    The above documentation has been reviewed and is accurate and complete Judi Saa, DO

## 2022-11-23 NOTE — Assessment & Plan Note (Signed)
I believe that patient's pain is extra-articular at this time of the knee.  Likely some nonspecific's swelling and inflammation noted of the hamstring tendon on the medial aspect that came to with his Pez anserine.  We discussed with patient about the possibility of PRP in the area but do not know if it will make a significant improvement.  Discussed thigh compression, home exercises, discussed Celebrex and see if this would be more beneficial.  See if we can decrease the amount of ibuprofen he is doing secondary to patient also having some gastrointestinal issues.  Follow-up with me again he wants to try any of the other treatments we discussed today.

## 2022-11-23 NOTE — Patient Instructions (Signed)
Celebrex 100mg  2x a day 60 Do not take with anti inflammatories Thigh compression sleeve Consider PRP

## 2022-11-24 ENCOUNTER — Encounter: Payer: Self-pay | Admitting: Family Medicine

## 2022-11-27 NOTE — Telephone Encounter (Signed)
Can you advise on where to double book or wait for a cancellation?

## 2022-12-06 DIAGNOSIS — G5781 Other specified mononeuropathies of right lower limb: Secondary | ICD-10-CM | POA: Diagnosis not present

## 2022-12-07 ENCOUNTER — Ambulatory Visit: Payer: Commercial Managed Care - PPO | Admitting: Allergy

## 2022-12-07 ENCOUNTER — Encounter: Payer: Self-pay | Admitting: Allergy

## 2022-12-07 VITALS — BP 106/70 | HR 76 | Temp 98.3°F | Resp 16 | Ht 67.0 in | Wt 224.5 lb

## 2022-12-07 DIAGNOSIS — K9049 Malabsorption due to intolerance, not elsewhere classified: Secondary | ICD-10-CM

## 2022-12-07 DIAGNOSIS — T781XXD Other adverse food reactions, not elsewhere classified, subsequent encounter: Secondary | ICD-10-CM

## 2022-12-07 DIAGNOSIS — T783XXD Angioneurotic edema, subsequent encounter: Secondary | ICD-10-CM | POA: Diagnosis not present

## 2022-12-07 DIAGNOSIS — T7819XD Other adverse food reactions, not elsewhere classified, subsequent encounter: Secondary | ICD-10-CM

## 2022-12-07 DIAGNOSIS — R198 Other specified symptoms and signs involving the digestive system and abdomen: Secondary | ICD-10-CM | POA: Diagnosis not present

## 2022-12-07 NOTE — Progress Notes (Signed)
New Patient Note  RE: Cody [name] MRN: [id number] DOB: 11-16-1965 Date of Office Visit: 12/07/2022  Consult requested by: [name], [name], MD Primary care provider: [name], [name], MD  Chief Complaint: Diarrhea (Ever since a child has had to use the restroom about 15 minutes after eating. In the last 4/5 months has had large amounts of bleeding when going to the restroom. Goes to the restroom several times a day. Breads, pastas irritate him. ) and Angioedema (Lip swelling about 2-3x a year. Takes a claritin and swelling goes away. )  History of Present Illness: I had the pleasure of seeing Cody [name] for initial evaluation at the [facility] on 12/07/2022. He is a 57 y.o. male, who is referred here by [name], [name], MD for the evaluation of food allergies.  Discussed the use of AI scribe software for clinical note transcription with the patient, who gave verbal consent to proceed.  The patient, a 57 year old individual with a lifelong history of frequent bowel movements, presents with recent onset of rectal bleeding. The patient reports a pattern of bowel movements occurring within 10-15 minutes of eating, regardless of the type of food consumed. This pattern has been consistent throughout his life, resulting in approximately six to seven bowel movements per day, typically loose but not classified as diarrhea.  Over the past few months, the patient has noticed the presence of blood in his stool, which he attributes to an anal fissure due to frequent bowel movements. The patient has been applying a prescribed treatment, but improvement has been minimal, possibly due to the frequency of bowel movements and subsequent irritation.  The patient denies any associated symptoms such as stomach pain, nausea, vomiting, itching, or rash. The patient underwent a colonoscopy approximately three years ago, which revealed no significant findings.  In addition to the  gastrointestinal symptoms, the patient reports episodes of lip swelling occurring approximately every three months for the past four years. The swelling resolves within an hour of taking an antihistamine, and no other symptoms are associated with these episodes. The patient has kept a food diary, but no specific triggers have been identified.  The patient maintains a diet that includes a variety of foods, including pasta, cheese, seafood, and chicken. He acknowledges that certain foods may exacerbate his symptoms (such as bread, pasta) but expresses reluctance to eliminate these foods from his diet. The patient denies any significant weight loss and reports a weight gain of approximately 17 pounds over the past seven months due to decreased physical activity following knee surgery.  The patient is a non-smoker and non-drinker, and he reports no known allergies.   Previous history of swelling: no. Family history of angioedema:  unsure - patient is adopted . Patient is up to date with the following cancer screening tests: yes. Ace-inhibitor use: no  Assessment and Plan: Cody [name] is a 57 y.o. male with: Gastrointestinal complaints Food intolerance Lifelong history of frequent, loose bowel movements following meals. No associated abdominal pain, nausea, vomiting, or systemic symptoms. Prior colonoscopy was unremarkable. Pasta/bread may worsen symptoms but unwilling to eliminate form diet. Recently having rectal bleeding from anal fissures. Discussed with patient that skin prick testing and bloodwork (food IgE levels) checks for IgE mediated reactions which his clinical presentation does not support.  Concerning more for IBS/IBD, food intolerance.  Keep a food journal with symptoms and foods eaten. Limit foods that trigger your symptoms. Keep visit with GI. May use lactose free milk  or take a lactaid pill right before consuming anything with dairy to see if there's a component of lactose intolerance.   Recommend bidet to decrease friction after bowel movements to allow anal fissure to heal.  Get bloodwork. Return for select food testing to rule things out.   Angioedema, subsequent encounter Recurrent episodes of lip swelling every few months for the past 4 years, resolving with Claritin. No known triggers identified. Not on ace-inhibitor.  Keep track of episodes.  Write down what you had done/eaten on the days of swelling. For mild symptoms you can take over the counter antihistamines such as Benadryl 1-2 tablets = 25-50mg  and monitor symptoms closely.  If symptoms worsen or if you have severe symptoms including breathing issues, throat closure, significant swelling, whole body hives, severe diarrhea and vomiting, lightheadedness then seek immediate medical care. Get bloodwork.  Return for Skin testing.  No orders of the defined types were placed in this encounter.  Lab Orders         Alpha-Gal Panel         ANA w/Reflex         C3 and C4         C1 esterase inhibitor, functional         C1 Esterase Inhibitor         CBC with Differential/Platelet         Celiac Ab tTG DGP TIgA         Chronic Urticaria         Complement component c1q         C-reactive protein         Tryptase         Protein Electrophoresis, Urine Rflx.         Protein electrophoresis, serum         Food Allergy Profile      Other allergy screening: Asthma: no Rhino conjunctivitis: no Medication allergy: no Hymenoptera allergy: no Urticaria: no Eczema:no History of recurrent infections suggestive of immunodeficency: no  Diagnostics: None.  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   Neck pain of over 3 months duration 12/20/2018   AC joint pain 09/30/2018   Calcaneal fracture 04/23/2017   Numbness and tingling 10/20/2015   RLS (restless legs syndrome) 10/20/2015   Past Medical History:  Diagnosis Date   Claustrophobia    Complication of anesthesia    dizzy, difficult time waking up   Hemorrhoid 01/2019   external   Medical history non-contributory    Past Surgical History: Past Surgical History:  Procedure Laterality Date   HERNIA REPAIR     umbicial   LAPAROSCOPIC CHOLECYSTECTOMY     NECK SURGERY     as a child- patient is not sure what itwas- had cat scratch fever   RADIOLOGY WITH ANESTHESIA N/A 01/13/2016   Procedure: MRI of brain with and without and cervical spine and lumbar spine;  Surgeon: Medication Radiologist, MD;  Location: [facility] OR;  Service: Radiology;  Laterality:  N/A;   RADIOLOGY WITH ANESTHESIA N/A 01/21/2019   Procedure: MRI WITH ANESTHESIA CERVICAL SPINE WITHOUT IV CONTRAST;  Surgeon: Radiologist, Medication, MD;  Location: [facility] OR;  Service: Radiology;  Laterality: N/A;   RADIOLOGY WITH ANESTHESIA N/A 09/20/2021   Procedure: MRI WITH ANESTHESIA LUMBAR SPINE WITHOUT CONTRAST;  Surgeon: Radiologist, Medication, MD;  Location: [facility] OR;  Service: Radiology;  Laterality: N/A;   Medication List:  Current Outpatient Medications  Medication Sig Dispense Refill   diltiazem 2 % GEL Apply 1 Application topically 2 (two) times daily. 60 g 1   gabapentin (NEURONTIN) 300 MG capsule Take 1 capsule (300 mg total) by mouth at bedtime. 90 capsule 1   No current facility-administered medications for this visit.   Allergies: No Known Allergies Social History: Social History   Socioeconomic History   Marital status: Married    Spouse name: Not on file   Number of children: Not on file   Years of education: Not on  file   Highest education level: Not on file  Occupational History   Not on file  Tobacco Use   Smoking status: Never   Smokeless tobacco: Never  Vaping Use   Vaping status: Never Used  Substance and Sexual Activity   Alcohol use: Yes    Comment: occassional   Drug use: No   Sexual activity: Yes  Other Topics Concern   Not on file  Social History Narrative   Not on file   Social Determinants of Health   Financial Resource Strain: Not on file  Food Insecurity: Not on file  Transportation Needs: Not on file  Physical Activity: Not on file  Stress: Not on file  Social Connections: Not on file   Lives in a house. Smoking: denies Occupation: retired  Landscape architect HistorySurveyor, minerals in the house: no Engineer, civil (consulting) in the family room: no Carpet in the bedroom: no Heating: electric Cooling: central Pet: yes 1 dog x 7 yrs  Family History: Family History  Adopted: Yes  Problem Relation Age of Onset   Colon polyps Father    Cancer Paternal Uncle        colon at 73.   Colon cancer Paternal Uncle    Esophageal cancer Neg Hx    Rectal cancer Neg Hx    Stomach cancer Neg Hx    Problem                               Relation Patient was adopted.   Review of Systems  Constitutional:  Negative for appetite change, chills, fever and unexpected weight change.  HENT:  Negative for congestion and rhinorrhea.   Eyes:  Negative for itching.  Respiratory:  Negative for cough, chest tightness, shortness of breath and wheezing.   Cardiovascular:  Negative for chest pain.  Gastrointestinal:  Positive for abdominal pain and diarrhea. Negative for nausea and vomiting.  Genitourinary:  Negative for difficulty urinating.  Skin:  Negative for rash.  Neurological:  Negative for headaches.    Objective: BP 106/70   Pulse 76   Temp 98.3 F (36.8 C)   Resp 16   Ht 5\' 7"  (1.702 m)   Wt 224 lb 8 oz (101.8 kg)   SpO2 96%   BMI 35.16 kg/m  Body mass index is 35.16  kg/m. Physical Exam Vitals and nursing note reviewed.  Constitutional:      Appearance: Normal appearance. He is well-developed.  HENT:  Head: Normocephalic and atraumatic.     Right Ear: Tympanic membrane and external ear normal.     Left Ear: Tympanic membrane and external ear normal.     Nose: Nose normal.     Mouth/Throat:     Mouth: Mucous membranes are moist.     Pharynx: Oropharynx is clear.  Eyes:     Conjunctiva/sclera: Conjunctivae normal.  Cardiovascular:     Rate and Rhythm: Normal rate and regular rhythm.     Heart sounds: Normal heart sounds. No murmur heard.    No friction rub. No gallop.  Pulmonary:     Effort: Pulmonary effort is normal.     Breath sounds: Normal breath sounds. No wheezing, rhonchi or rales.  Musculoskeletal:     Cervical back: Neck supple.  Skin:    General: Skin is warm.     Findings: No rash.  Neurological:     Mental Status: He is alert and oriented to person, place, and time.  Psychiatric:        Behavior: Behavior normal.   The plan was reviewed with the patient/family, and all questions/concerned were addressed.  It was my pleasure to see Cody [name] today and participate in his care. Please feel free to contact me with any questions or concerns.  Sincerely,  Wyline Mood, DO Allergy & Immunology  [facility] office: [phone] [facility] office: [phone]

## 2022-12-07 NOTE — Patient Instructions (Addendum)
GI symptoms Discussed with patient that skin prick testing and bloodwork (food IgE levels) checks for IgE mediated reactions which his clinical presentation does not support.  Concerning more for IBS/IBD, food intolerance.  Keep a food journal with symptoms and foods eaten. Limit foods that trigger your symptoms. Keep visit with GI.  Possible lactose intolerance May use lactose free milk or take a lactaid pill right before consuming anything with dairy.  Swelling Keep track of episodes.  Write down what you had done/eaten on the days of swelling. For mild symptoms you can take over the counter antihistamines such as Benadryl 1-2 tablets = 25-50mg  and monitor symptoms closely.  If symptoms worsen or if you have severe symptoms including breathing issues, throat closure, significant swelling, whole body hives, severe diarrhea and vomiting, lightheadedness then seek immediate medical care.  Get bloodwork We are ordering labs, so please allow 1-2 weeks for the results to come back. With the newly implemented Cures Act, the labs might be visible to you at the same time that they become visible to me. However, I will not address the results until all of the results are back, so please be patient.  In the meantime, continue recommendations in your patient instructions, including avoidance measures (if applicable), until you hear from me.  Follow up for select food testing. Will make additional recommendations based on results. Make sure you don't take any antihistamines for 3 days before the skin testing appointment. Don't put any lotion on the back and arms on the day of testing.  Plan on being here for 30-60 minutes.

## 2023-01-08 ENCOUNTER — Other Ambulatory Visit (HOSPITAL_COMMUNITY): Payer: Self-pay

## 2023-01-08 ENCOUNTER — Ambulatory Visit: Payer: Commercial Managed Care - PPO | Admitting: Gastroenterology

## 2023-01-08 ENCOUNTER — Encounter: Payer: Self-pay | Admitting: Gastroenterology

## 2023-01-08 VITALS — BP 108/80 | HR 89 | Ht 67.0 in | Wt 221.1 lb

## 2023-01-08 DIAGNOSIS — K602 Anal fissure, unspecified: Secondary | ICD-10-CM | POA: Diagnosis not present

## 2023-01-08 DIAGNOSIS — K580 Irritable bowel syndrome with diarrhea: Secondary | ICD-10-CM

## 2023-01-08 DIAGNOSIS — R195 Other fecal abnormalities: Secondary | ICD-10-CM | POA: Diagnosis not present

## 2023-01-08 DIAGNOSIS — K625 Hemorrhage of anus and rectum: Secondary | ICD-10-CM | POA: Diagnosis not present

## 2023-01-08 DIAGNOSIS — K648 Other hemorrhoids: Secondary | ICD-10-CM

## 2023-01-08 DIAGNOSIS — R109 Unspecified abdominal pain: Secondary | ICD-10-CM | POA: Diagnosis not present

## 2023-01-08 MED ORDER — AMBULATORY NON FORMULARY MEDICATION: 1 refills | Status: AC

## 2023-01-08 MED ORDER — DICYCLOMINE HCL 10 MG PO CAPS
10.0000 mg | ORAL_CAPSULE | Freq: Three times a day (TID) | ORAL | 0 refills | Status: AC
  Filled 2023-01-08: qty 90, 30d supply, fill #0

## 2023-01-08 NOTE — Progress Notes (Signed)
Chief Complaint: Chronic loose stools, rectal bleeding Primary GI MD: Dr. Rhea Belton  HPI: 57 year old male with PMH as below presents for evaluation of chronic loose stools ongoing for "his entire life" and rectal bleeding.  Discussed the use of AI scribe software for clinical note transcription with the patient, who gave verbal consent to proceed.  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 The patient reports a lifelong history of frequent bowel movements, up to six to seven times daily, and describes the consistency as a 5-6 on the [facility] Stool Chart. He has been managing the symptoms with fiber gummies, which have slightly improved the bulk of the stool. However, the patient has also been using various ointments and suppositories, including hemorrhoid cream (daily for 2 months), A&D ointment, and diltiazem, to manage rectal discomfort and bleeding. He has also been using suppositories often and wipes vigorously with witch hazel wipes.  The patient describes a history of rectal bleeding, which has been present for many years but has recently become more frequent. The bleeding is typically bright red and noticed on wiping. The patient also reports a sensation of tearing during bowel movements, despite the stool being soft.  The patient's primary care provider has previously identified anal fissures and a hemorrhoid, which the patient's spouse has also confirmed. Patient's spouse is a PCP and did a rectal exam on him confirmed internal hemorrhoid. The patient has been using suppositories and various ointments to manage these symptoms, but the discomfort and bleeding persist.  The patient has a history of colonoscopy performed three years ago, which was reportedly normal. He is not due for another colonoscopy until 2028. The patient is also scheduled for  allergy testing in the coming months including celiac testing.     PREVIOUS GI WORKUP   Colonoscopy 02/2019 - The examined portion of the ileum was normal.  - One 6 mm polyp in the transverse colon, removed with a cold snare. Resected and retrieved.  - One 5 mm polyp in the descending colon, removed with a cold snare. Resected and retrieved.  - One 4 mm polyp in the sigmoid colon, removed with a cold snare. Resected and retrieved.  - Small internal hemorrhoids. - repeat 7 years (02/2026)  1. Surgical [P], colon, descending and transverse, polyp (2) - TUBULAR ADENOMA(S). - NO HIGH GRADE DYSPLASIA OR MALIGNANCY. 2. Surgical [P], colon, sigmoid, polyp - POLYPOID COLONIC MUCOSA. - NO ADENOMATOUS CHANGE OR MALIGNANCY.  Past Medical History:  Diagnosis Date   Claustrophobia    Complication of anesthesia    dizzy, difficult time waking up   Hemorrhoid 01/2019   external   Medical history non-contributory     Past Surgical History:  Procedure Laterality Date   HERNIA REPAIR     umbicial   LAPAROSCOPIC CHOLECYSTECTOMY     NECK SURGERY     as a child- patient is not sure what itwas- had cat scratch fever   RADIOLOGY WITH ANESTHESIA N/A 01/13/2016   Procedure: MRI of brain with and without and cervical spine and lumbar spine;  Surgeon: Medication Radiologist, MD;  Location: [facility] OR;  Service: Radiology;  Laterality: N/A;   RADIOLOGY WITH ANESTHESIA N/A 01/21/2019   Procedure: MRI WITH ANESTHESIA CERVICAL SPINE WITHOUT IV CONTRAST;  Surgeon: Radiologist, Medication, MD;  Location: [facility] OR;  Service: Radiology;  Laterality: N/A;   RADIOLOGY WITH ANESTHESIA  N/A 09/20/2021   Procedure: MRI WITH ANESTHESIA LUMBAR SPINE WITHOUT CONTRAST;  Surgeon: Radiologist, Medication, MD;  Location: [facility] OR;  Service: Radiology;  Laterality: N/A;    Current Outpatient Medications  Medication Sig Dispense Refill   AMBULATORY NON FORMULARY MEDICATION Medication Name: Diltiazem Gel 2% Use Pea sized amount three  times a day for 6-8 weeks to rectal area 30 g 1   dicyclomine (BENTYL) 10 MG capsule Take 1 capsule (10 mg total) by mouth 3 (three) times daily before meals. 90 capsule 0   diltiazem 2 % GEL Apply 1 Application topically 2 (two) times daily. 60 g 1   gabapentin (NEURONTIN) 300 MG capsule Take 1 capsule (300 mg total) by mouth at bedtime. 90 capsule 1   No current facility-administered medications for this visit.    Allergies as of 01/08/2023   (No Known Allergies)    Family History  Adopted: Yes  Problem Relation Age of Onset   Colon polyps Father    Cancer Paternal Uncle        colon at 77.   Colon cancer Paternal Uncle    Esophageal cancer Neg Hx    Rectal cancer Neg Hx    Stomach cancer Neg Hx     Social History   Socioeconomic History   Marital status: Married    Spouse name: Not on file   Number of children: Not on file   Years of education: Not on file   Highest education level: Not on file  Occupational History   Not on file  Tobacco Use   Smoking status: Never   Smokeless tobacco: Never  Vaping Use   Vaping status: Never Used  Substance and Sexual Activity   Alcohol use: Yes    Comment: occassional   Drug use: No   Sexual activity: Yes  Other Topics Concern   Not on file  Social History Narrative   Not on file   Social Determinants of Health   Financial Resource Strain: Not on file  Food Insecurity: Not on file  Transportation Needs: Not on file  Physical Activity: Not on file  Stress: Not on file  Social Connections: Not on file  Intimate Partner Violence: Not on file    Review of Systems:    Constitutional: No weight loss, fever, chills, weakness or fatigue HEENT: Eyes: No change in vision               Ears, Nose, Throat:  No change in hearing or congestion Skin: No rash or itching Cardiovascular: No chest pain, chest pressure or palpitations   Respiratory: No SOB or cough Gastrointestinal: See HPI and otherwise negative Genitourinary:  No dysuria or change in urinary frequency Neurological: No headache, dizziness or syncope Musculoskeletal: No new muscle or joint pain Hematologic: No bleeding or bruising Psychiatric: No history of depression or anxiety    Physical Exam:  Vital signs: BP 108/80   Pulse 89   Ht 5\' 7"  (1.702 m)   Wt 221 lb 2 oz (100.3 kg)   SpO2 98%   BMI 34.63 kg/m   Constitutional: NAD, Well developed, Well nourished, alert and cooperative Head:  Normocephalic and atraumatic. Eyes:   PEERL, EOMI. No icterus. Conjunctiva pink. Respiratory: Respirations even and unlabored. Lungs clear to auscultation bilaterally.   No wheezes, crackles, or rhonchi.  Cardiovascular:  Regular rate and rhythm. No peripheral edema, cyanosis or pallor.  Gastrointestinal:  Soft, nondistended, nontender. No rebound or guarding. Normal bowel sounds. No appreciable masses or  hepatomegaly. Rectal:  rectal with anoscopy. Robin CMA chaperone. Erythematous appearing gluteal cleft with multiple small fissures around the anus.  Two internal hemorrhoids noted on anoscopy - moderate sized. Msk:  Symmetrical without gross deformities. Without edema, no deformity or joint abnormality.  Neurologic:  Alert and  oriented x4;  grossly normal neurologically.  Skin:   Dry and intact without significant lesions or rashes. Psychiatric: Oriented to person, place and time. Demonstrates good judgement and reason without abnormal affect or behaviors.  RELEVANT LABS AND IMAGING: CBC    Component Value Date/Time   WBC 5.8 11/20/2022 0821   RBC 4.87 11/20/2022 0821   HGB 14.8 11/20/2022 0821   HCT 43.6 11/20/2022 0821   PLT 230.0 11/20/2022 0821   MCV 89.6 11/20/2022 0821   MCHC 33.9 11/20/2022 0821   RDW 12.6 11/20/2022 0821   LYMPHSABS 2.0 09/13/2021 0739   MONOABS 0.3 09/13/2021 0739   EOSABS 0.1 09/13/2021 0739   BASOSABS 0.0 09/13/2021 0739    CMP     Component Value Date/Time   NA 138 11/20/2022 0821   K 3.8 11/20/2022 0821    CL 103 11/20/2022 0821   CO2 26 11/20/2022 0821   GLUCOSE 94 11/20/2022 0821   BUN 17 11/20/2022 0821   CREATININE 0.96 11/20/2022 0821   CALCIUM 9.5 11/20/2022 0821   PROT 7.2 11/20/2022 0821   ALBUMIN 4.3 11/20/2022 0821   AST 18 11/20/2022 0821   ALT 23 11/20/2022 0821   ALKPHOS 55 11/20/2022 0821   BILITOT 0.9 11/20/2022 0821     Assessment/Plan:      Anal Fissures and Hemorrhoids Physical exam shows erythematous/irritated gluteal area with multiple small fissures, no external hemorrhoids. Two internal hemorrhoids on anoscopy. Chronic issue likely worsened with vigorous wiping, daily hydrocortisone cream, etc. --Discontinue current regimen and refrain from daily hemorrhoid cream and witch hazel as it is likely worsening the area. --Use diltiazem ointment for 6-8 weeks --Apply Desitin after every bowel movement as a barrier cream -- schedule hemorrhoid banding with Dr. Rhea Belton -- discussed "patting" with wiping and avoiding vigorous wiping.  Chronic Diarrhea Lifelong issue with frequent, loose bowel movements. 6 per day, most of the time after eating. Recent use of fiber gummies has provided some improvement.  --Discontinue fiber gummies and start fiber powder. --Order stool study to check for pancreatic insufficiency since he noted "oily stools" occasionally. --Prescribe dicyclomine up to four times a day to help with loose stools if fiber does not result in improvement.  General Health Maintenance -Continue colonoscopy surveillance as per previous schedule (next due in 2028) unless GI symptoms persist. -Plan for allergy testing in January in which celiac panel is included.     Lara Mulch [facility] 01/08/2023, 12:59 PM  Cc: [name], [name], MD

## 2023-01-08 NOTE — Patient Instructions (Addendum)
Desitin after every bowel movement Diltiazem gel 6-8 weeks DISCONTINUE hemorrhoid cream Benefiber 2-3 times daily   Today, we discussed your ongoing issues with rectal bleeding and discomfort, as well as your frequent bowel movements. We reviewed your history of anal fissures and hemorrhoids, and we talked about your recent weight gain and its impact on your health. We also discussed your upcoming allergy testing and your colonoscopy schedule.  YOUR PLAN:  -ANAL FISSURES AND HEMORRHOIDS: Anal fissures are small tears in the lining of the anus, and hemorrhoids are swollen veins in the lower rectum. Both can cause pain and bleeding. We will discontinue your current treatments and start using diltiazem ointment for 6-8 weeks, applying it mainly on the outside and just inside the anus. Additionally, you should apply Desitin after every bowel movement. We may consider a procedure called hemorrhoid banding with Dr. Rhea Belton after further examination.  -CHRONIC DIARRHEA: Chronic diarrhea involves frequent, loose bowel movements. We will stop the fiber gummies and start using Benefiber powder instead. We will also conduct a stool study to check for pancreatic insufficiency, and you will take dicyclomine up to four times a day to help manage the loose stools.  -GENERAL HEALTH MAINTENANCE: Continue with your scheduled colonoscopy surveillance, with the next one due in 2028. Also, proceed with your planned allergy testing in January.  INSTRUCTIONS:  Please follow up with Dr. Rhea Belton for a potential hemorrhoid banding procedure. Complete the stool study as ordered, and start taking dicyclomine as prescribed. Continue with your scheduled colonoscopy in 2028 and your allergy testing in January.  Due to recent changes in healthcare laws, you may see the results of your imaging and laboratory studies on MyChart before your provider has had a chance to review them.  We understand that in some cases there may be  results that are confusing or concerning to you. Not all laboratory results come back in the same time frame and the provider may be waiting for multiple results in order to interpret others.  Please give [place] 48 hours in order for your provider to thoroughly review all the results before contacting the office for clarification of your results.   Your provider has prescribed Diltiazem  gel for you. Please follow the directions written on your prescription bottle or given to you specifically by your provider. Since this is a specialty medication and is not readily available at most local pharmacies,Please take your prescription to    [facility] information is below: Address: [address], [place], [place] [address]  [phone]    I appreciate the  opportunity to care for you  Thank You   [facility]

## 2023-01-10 ENCOUNTER — Other Ambulatory Visit (HOSPITAL_COMMUNITY): Payer: Self-pay

## 2023-01-13 ENCOUNTER — Telehealth: Payer: Commercial Managed Care - PPO | Admitting: Physician Assistant

## 2023-01-13 DIAGNOSIS — M5441 Lumbago with sciatica, right side: Secondary | ICD-10-CM

## 2023-01-13 DIAGNOSIS — G8929 Other chronic pain: Secondary | ICD-10-CM

## 2023-01-13 MED ORDER — NAPROXEN 500 MG PO TABS
500.0000 mg | ORAL_TABLET | Freq: Two times a day (BID) | ORAL | 0 refills | Status: DC
  Filled 2023-01-13: qty 30, 15d supply, fill #0

## 2023-01-13 MED ORDER — CYCLOBENZAPRINE HCL 10 MG PO TABS
10.0000 mg | ORAL_TABLET | Freq: Three times a day (TID) | ORAL | 0 refills | Status: DC | PRN
  Filled 2023-01-13: qty 30, 10d supply, fill #0

## 2023-01-13 NOTE — Progress Notes (Signed)
 E-Visit for Back Pain   We are sorry that you are not feeling well.  Here is how we plan to help!  Based on what you have shared with me it looks like you mostly have acute back pain.  Acute back pain is defined as musculoskeletal pain that can resolve in 1-3 weeks with conservative treatment.  I have prescribed Naprosyn 500 mg take one by mouth twice a day non-steroid anti-inflammatory (NSAID) as well as Flexeril 10 mg every eight hours as needed which is a muscle relaxer  Some patients experience stomach irritation or in increased heartburn with anti-inflammatory drugs.  Please keep in mind that muscle relaxer's can cause fatigue and should not be taken while at work or driving.  Back pain is very common.  The pain often gets better over time.  The cause of back pain is usually not dangerous.  Most people can learn to manage their back pain on their own.  Home Care Stay active.  Start with short walks on flat ground if you can.  Try to walk farther each day. Do not sit, drive or stand in one place for more than 30 minutes.  Do not stay in bed. Do not avoid exercise or work.  Activity can help your back heal faster. Be careful when you bend or lift an object.  Bend at your knees, keep the object close to you, and do not twist. Sleep on a firm mattress.  Lie on your side, and bend your knees.  If you lie on your back, put a pillow under your knees. Only take medicines as told by your doctor. Put ice on the injured area. Put ice in a plastic bag Place a towel between your skin and the bag Leave the ice on for 15-20 minutes, 3-4 times a day for the first 2-3 days. 210 After that, you can switch between ice and heat packs. Ask your doctor about back exercises or massage. Avoid feeling anxious or stressed.  Find good ways to deal with stress, such as exercise.  Get Help Right Way If: Your pain does not go away with rest or medicine. Your pain does not go away in 1 week. You have new  problems. You do not feel well. The pain spreads into your legs. You cannot control when you poop (bowel movement) or pee (urinate) You feel sick to your stomach (nauseous) or throw up (vomit) You have belly (abdominal) pain. You feel like you may pass out (faint). If you develop a fever.  Make Sure you: Understand these instructions. Will watch your condition Will get help right away if you are not doing well or get worse.  Your e-visit answers were reviewed by a board certified advanced clinical practitioner to complete your personal care plan.  Depending on the condition, your plan could have included both over the counter or prescription medications.  If there is a problem please reply  once you have received a response from your provider.  Your safety is important to [place].  If you have drug allergies check your prescription carefully.    You can use MyChart to ask questions about today's visit, request a non-urgent call back, or ask for a work or school excuse for 24 hours related to this e-Visit. If it has been greater than 24 hours you will need to follow up with your provider, or enter a new e-Visit to address those concerns.  You will get an e-mail in the next two days asking about  your experience.  I hope that your e-visit has been valuable and will speed your recovery. Thank you for using e-visits.   I have spent 5 minutes in review of e-visit questionnaire, review and updating patient chart, medical decision making and response to patient.   Tylene Fantasia Ward, PA-C

## 2023-01-15 ENCOUNTER — Other Ambulatory Visit (HOSPITAL_BASED_OUTPATIENT_CLINIC_OR_DEPARTMENT_OTHER): Payer: Self-pay

## 2023-01-16 ENCOUNTER — Ambulatory Visit: Payer: Commercial Managed Care - PPO | Admitting: Allergy

## 2023-01-17 NOTE — Progress Notes (Signed)
Addendum: Reviewed and agree with assessment and management plan. Kadijah Shamoon M, MD  

## 2023-01-29 ENCOUNTER — Encounter: Payer: Self-pay | Admitting: Family Medicine

## 2023-01-30 ENCOUNTER — Other Ambulatory Visit (HOSPITAL_BASED_OUTPATIENT_CLINIC_OR_DEPARTMENT_OTHER): Payer: Self-pay

## 2023-01-30 ENCOUNTER — Other Ambulatory Visit: Payer: Self-pay

## 2023-01-30 MED ORDER — GABAPENTIN 300 MG PO CAPS
300.0000 mg | ORAL_CAPSULE | Freq: Every day | ORAL | 1 refills | Status: DC
  Filled 2023-01-30: qty 90, 90d supply, fill #0
  Filled 2023-05-08: qty 90, 90d supply, fill #1

## 2023-02-15 ENCOUNTER — Other Ambulatory Visit (HOSPITAL_BASED_OUTPATIENT_CLINIC_OR_DEPARTMENT_OTHER): Payer: Self-pay

## 2023-02-15 ENCOUNTER — Other Ambulatory Visit: Payer: Self-pay

## 2023-02-15 ENCOUNTER — Ambulatory Visit: Payer: Commercial Managed Care - PPO | Admitting: Sports Medicine

## 2023-02-15 VITALS — BP 140/82 | HR 86 | Ht 67.0 in | Wt 221.0 lb

## 2023-02-15 DIAGNOSIS — G8929 Other chronic pain: Secondary | ICD-10-CM | POA: Diagnosis not present

## 2023-02-15 DIAGNOSIS — M25561 Pain in right knee: Secondary | ICD-10-CM

## 2023-02-15 MED ORDER — METHYLPREDNISOLONE 4 MG PO TBPK
ORAL_TABLET | ORAL | 0 refills | Status: DC
  Filled 2023-02-15: qty 21, 6d supply, fill #0

## 2023-02-15 NOTE — Patient Instructions (Signed)
 Prednisone dose pak Keep appt with Dr. Katrinka Blazing

## 2023-02-15 NOTE — Addendum Note (Signed)
 Addended by: Jerene Canny R on: 02/15/2023 02:42 PM   Modules accepted: Orders

## 2023-02-15 NOTE — Progress Notes (Signed)
 Ben Leeta Grimme D.[facility] [address] [place] [address] Phone: [phone]   Assessment and Plan:     1. Chronic pain of right knee -Chronic with exacerbation, subsequent visit - Recurrent flare of medial right knee pain.  Pain appears to be localized to MCL versus SGT tendons crossing medial joint line.  I would suspect grade 1 MCL strain versus adductor tendinitis/pes anserine bursitis based on physical exam today without MOI. Differential includes complex regional pain syndrome - Start prednisone  Dosepak - Patient has been using naproxen  500 mg twice daily and gabapentin  300 mg nightly over the past 2 weeks with mild relief in symptoms - Patient is scheduled to follow-up in 3 weeks for PRP injection.  Could consider PRP along MCL versus pes anserine bursa versus intra-articular based on patient's presentation - Patient had CT right knee 09/12/2022 that was unremarkable.  He has had 2 MRIs within the past year, though these reports are not available within our system.  Self reports mild meniscal pathology that was treated arthroscopically without improvement in symptoms and mild MCL strain  Sports Medicine: Musculoskeletal Ultrasound.   Exam:Right ultrasound exam limited knee   Quadriceps tendon:  Normal Pes anserine bursa/tendons: Normal, though TTP at bursa and along tendons MCL: Intact without edema, though TTP   Effusion: absent Additional findings: None    Impression:  1.  Unremarkable ultrasound   Pertinent previous records reviewed include CT right knee 09/12/2022  Follow Up: Patient is scheduled to follow-up in 3 weeks for PRP injection.  Could consider PRP along MCL versus pes anserine bursa versus intra-articular based on patient's presentation.  If PRP is ineffective in controlling symptoms, could consider duloxetine versus ECSWT which were discussed with patient at today's visit   Subjective:   I, Cody [name], am  serving as a neurosurgeon for Doctor Morene Mace  Chief Complaint: right knee pain   HPI:   11/23/2022 Cody [name] is a 58 y.o. male coming in with complaint of still terrible. Nerve block didn't help at all. Wants other options.   Patient was seen by an outside provider on the eighth, which was 2 days ago, who once again had a another MRI that did not show any acute appearing intra-articular issue of the knee.  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  Patient is scheduled for an adductor canal injection around the saphenous nerve   Patient has had significant amount of interventions including the Pez anserine injection in June that did not make any significant improvement.  Patient continues to have pain otherwise   02/15/23 Patient states that 2 weeks ago he had a pain flare. He wasn't able to walk . Pain has resolved some over past couple of days   Relevant Historical Information: None pertinent  Additional pertinent review of systems negative.   Current Outpatient Medications:    AMBULATORY NON FORMULARY MEDICATION, Medication Name: Diltiazem  Gel 2% Use Pea sized amount three times a day for 6-8 weeks to rectal area, Disp: 30 g, Rfl: 1   cyclobenzaprine  (FLEXERIL ) 10 MG tablet, Take 1 tablet (10 mg total) by mouth 3 (three) times daily as needed for muscle spasms., Disp: 30 tablet, Rfl: 0   dicyclomine  (BENTYL ) 10 MG capsule, Take 1 capsule (10 mg total) by mouth 3 (three) times daily before meals., Disp: 90 capsule, Rfl: 0   gabapentin  (NEURONTIN ) 300 MG capsule, Take 1 capsule (300 mg total) by mouth at bedtime., Disp:  90 capsule, Rfl: 1   naproxen  (NAPROSYN ) 500 MG tablet, Take 1 tablet (500 mg total) by mouth 2 (two) times daily with a meal., Disp: 30 tablet, Rfl: 0   diltiazem  2 % GEL, Apply 1 Application topically 2 (two) times daily., Disp: 60 g, Rfl: 1   Objective:     Vitals:   02/15/23 1357  BP: (!) 140/82  Pulse: 86  SpO2: 98%  Weight: 221 lb  (100.2 kg)  Height: 5' 7 (1.702 m)      Body mass index is 34.61 kg/m.    Physical Exam:    General:  awake, alert oriented, no acute distress nontoxic Skin: no suspicious lesions or rashes Neuro:sensation intact and strength 5/5 with no deficits, no atrophy, normal muscle tone Psych: No signs of anxiety, depression or other mood disorder  Right knee: No swelling No deformity Neg fluid wave, joint milking ROM Flex 110, Ext 0 TTP medial femoral condyle, medial joint line, pes anserine bursa NTTP over the quad tendon,   lat fem condyle, patella, plica, patella tendon, tibial tuberostiy, fibular head, posterior fossa,  gerdy's tubercle,   lateral jt line Neg anterior and posterior drawer Neg lachman Neg sag sign Negative varus stress Negative valgus stress for laxity, though reproduced pain Negative McMurray    Gait antalgic, favoring left leg   Electronically signed by:  Odis Mace D[facility] 2:40 PM 02/15/23

## 2023-02-20 ENCOUNTER — Ambulatory Visit: Payer: Commercial Managed Care - PPO | Admitting: Physician Assistant

## 2023-02-27 DIAGNOSIS — Z9889 Other specified postprocedural states: Secondary | ICD-10-CM | POA: Diagnosis not present

## 2023-03-06 NOTE — Progress Notes (Unsigned)
[name] [address] [place] [address] Phone: [phone] Subjective:   Cody [name], am serving as a scribe for Dr. Antoine Primas.  I'm seeing this patient by the request  of:  [name], [name], MD  CC: Right leg pain  BJY:NWGNFAOZHY  Cody [name] is a 58 y.o. male coming in with complaint of R knee pain. Patient here for PRP. Patient states that the saphenous nerve block did help.       Past Medical History:  Diagnosis Date   Claustrophobia    Complication of anesthesia    dizzy, difficult time waking up   Hemorrhoid 01/2019   external   Medical history non-contributory    Past Surgical History:  Procedure Laterality Date   HERNIA REPAIR     umbicial   LAPAROSCOPIC CHOLECYSTECTOMY     NECK SURGERY     as a child- patient is not sure what itwas- had cat scratch fever   RADIOLOGY WITH ANESTHESIA N/A 01/13/2016   Procedure: MRI of brain with and without and cervical spine and lumbar spine;  Surgeon: Medication Radiologist, MD;  Location: [facility] OR;  Service: Radiology;  Laterality: N/A;   RADIOLOGY WITH ANESTHESIA N/A 01/21/2019   Procedure: MRI WITH ANESTHESIA CERVICAL SPINE WITHOUT IV CONTRAST;  Surgeon: Radiologist, Medication, MD;  Location: [facility] OR;  Service: Radiology;  Laterality: N/A;   RADIOLOGY WITH ANESTHESIA N/A 09/20/2021   Procedure: MRI WITH ANESTHESIA LUMBAR SPINE WITHOUT CONTRAST;  Surgeon: Radiologist, Medication, MD;  Location: [facility] OR;  Service: Radiology;  Laterality: N/A;   Social History   Socioeconomic History   Marital status: Married    Spouse name: Not on file   Number of children: Not on file   Years of education: Not on file   Highest education level: Not on file  Occupational History   Not on file  Tobacco Use   Smoking status: Never   Smokeless tobacco: Never  Vaping Use   Vaping status: Never Used  Substance and Sexual Activity   Alcohol use: Yes    Comment: occassional   Drug use: No    Sexual activity: Yes  Other Topics Concern   Not on file  Social History Narrative   Not on file   Social Drivers of Health   Financial Resource Strain: Not on file  Food Insecurity: Not on file  Transportation Needs: Not on file  Physical Activity: Not on file  Stress: Not on file  Social Connections: Not on file   No Known Allergies Family History  Adopted: Yes  Problem Relation Age of Onset   Colon polyps Father    Cancer Paternal Uncle        colon at 33.   Colon cancer Paternal Uncle    Esophageal cancer Neg Hx    Rectal cancer Neg Hx    Stomach cancer Neg Hx     Current Outpatient Medications (Endocrine & Metabolic):    methylPREDNISolone (MEDROL DOSEPAK) 4 MG TBPK tablet, Take 6 tablets on day 1.  Take 5 tablets on day 2.  Take 4 tablets on day 3.  Take 3 tablets on day 4.  Take 2 tablets on day 5.  Take 1 tablet on day 6.  Current Outpatient Medications (Cardiovascular):    diltiazem 2 % GEL*, Apply 1 Application topically 2 (two) times daily.   Current Outpatient Medications (Analgesics):    naproxen (NAPROSYN) 500 MG tablet, Take 1 tablet (500 mg total) by mouth  2 (two) times daily with a meal.   Current Outpatient Medications (Other):    AMBULATORY NON FORMULARY MEDICATION, Medication Name: Diltiazem Gel 2% Use Pea sized amount three times a day for 6-8 weeks to rectal area   cyclobenzaprine (FLEXERIL) 10 MG tablet, Take 1 tablet (10 mg total) by mouth 3 (three) times daily as needed for muscle spasms.   dicyclomine (BENTYL) 10 MG capsule, Take 1 capsule (10 mg total) by mouth 3 (three) times daily before meals.   diltiazem 2 % GEL*, Apply 1 Application topically 2 (two) times daily.   gabapentin (NEURONTIN) 300 MG capsule, Take 1 capsule (300 mg total) by mouth at bedtime. * These medications belong to multiple therapeutic classes and are listed under each applicable group.  Objective  Blood pressure 118/82, pulse 65, height 5\' 7"  (1.702 m), SpO2 98%.    General: No apparent distress alert and oriented x3 mood and affect normal, dressed appropriately.  HEENT: Pupils equal, extraocular movements intact   Procedure: Real-time Ultrasound Guided Injection of right medial knee Device: GE Logiq Q7 Ultrasound guided injection is preferred based studies that show increased duration, increased effect, greater accuracy, decreased procedural pain, increased response rate, and decreased cost with ultrasound guided versus blind injection.  Verbal informed consent obtained.  Time-out conducted.  Noted no overlying erythema, induration, or other signs of local infection.  Skin prepped in a sterile fashion.  Local anesthesia: Topical Ethyl chloride.  With sterile technique and under real time ultrasound guidance: With a 21-gauge 2 inch needle patient was injected with 1 cc of 0.5% Marcaine from the medial joint line and Pez anserine area approximately to the infra patella cutaneous nerve branch of the saphenous nerve.  Then injected with 5 cc in and around the nerve sheath, hamstring tendon, and Pez anserine area.  Minimal blood loss picture saved in patient's chart Completed without difficulty  Pain immediately resolved suggesting accurate placement of the medication.  Advised to call if fevers/chills, erythema, induration, drainage, or persistent bleeding.  Impression: Technically successful ultrasound guided injection.    Impression and Recommendations:    The above documentation has been reviewed and is accurate and complete Judi Saa, DO

## 2023-03-07 ENCOUNTER — Encounter: Payer: Self-pay | Admitting: Family Medicine

## 2023-03-07 ENCOUNTER — Other Ambulatory Visit: Payer: Self-pay

## 2023-03-07 ENCOUNTER — Ambulatory Visit: Payer: Self-pay | Admitting: Family Medicine

## 2023-03-07 VITALS — BP 118/82 | HR 65 | Ht 67.0 in

## 2023-03-07 DIAGNOSIS — S76311D Strain of muscle, fascia and tendon of the posterior muscle group at thigh level, right thigh, subsequent encounter: Secondary | ICD-10-CM

## 2023-03-07 DIAGNOSIS — M25561 Pain in right knee: Secondary | ICD-10-CM

## 2023-03-07 DIAGNOSIS — G8929 Other chronic pain: Secondary | ICD-10-CM

## 2023-03-07 NOTE — Patient Instructions (Signed)
No ice or IBU for 3 days Heat and Tylenol are ok See me again in 6-8 weeks to recheck

## 2023-03-07 NOTE — Assessment & Plan Note (Addendum)
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

## 2023-03-13 ENCOUNTER — Ambulatory Visit: Payer: Commercial Managed Care - PPO | Admitting: Allergy

## 2023-03-20 ENCOUNTER — Ambulatory Visit: Payer: Commercial Managed Care - PPO | Admitting: Internal Medicine

## 2023-03-20 ENCOUNTER — Encounter: Payer: Self-pay | Admitting: Internal Medicine

## 2023-03-20 VITALS — BP 94/76 | HR 76 | Ht 66.0 in | Wt 222.2 lb

## 2023-03-20 DIAGNOSIS — Z860101 Personal history of adenomatous and serrated colon polyps: Secondary | ICD-10-CM

## 2023-03-20 DIAGNOSIS — K529 Noninfective gastroenteritis and colitis, unspecified: Secondary | ICD-10-CM | POA: Diagnosis not present

## 2023-03-20 DIAGNOSIS — Z860100 Personal history of colon polyps, unspecified: Secondary | ICD-10-CM

## 2023-03-20 DIAGNOSIS — K648 Other hemorrhoids: Secondary | ICD-10-CM

## 2023-03-20 DIAGNOSIS — K6289 Other specified diseases of anus and rectum: Secondary | ICD-10-CM

## 2023-03-20 DIAGNOSIS — Z800 Family history of malignant neoplasm of digestive organs: Secondary | ICD-10-CM | POA: Diagnosis not present

## 2023-03-20 DIAGNOSIS — K602 Anal fissure, unspecified: Secondary | ICD-10-CM | POA: Diagnosis not present

## 2023-03-20 DIAGNOSIS — R195 Other fecal abnormalities: Secondary | ICD-10-CM

## 2023-03-20 NOTE — Patient Instructions (Signed)
 Please purchase the following medications over the counter and take as directed: Benefiber twice daily and Desitin to apply after your showering.  You will be due for a recall colonoscopy in 02/2024. We will send you a reminder in the mail when it gets closer to that time.  _______________________________________________________  If your blood pressure at your visit was 140/90 or greater, please contact your primary care physician to follow up on this.  _______________________________________________________  If you are age 56 or older, your body mass index should be between 23-30. Your Body mass index is 35.87 kg/m. If this is out of the aforementioned range listed, please consider follow up with your Primary Care Provider.  If you are age 15 or younger, your body mass index should be between 19-25. Your Body mass index is 35.87 kg/m. If this is out of the aformentioned range listed, please consider follow up with your Primary Care Provider.   ________________________________________________________  The [facility] providers would like to encourage you to use MYCHART to communicate with providers for non-urgent requests or questions.  Due to long hold times on the telephone, sending your provider a message by [facility] may be a faster and more efficient way to get a response.  Please allow 48 business hours for a response.  Please remember that this is for non-urgent requests.  _______________________________________________________

## 2023-03-20 NOTE — Progress Notes (Signed)
   Subjective:    Patient ID: Cody [name], male    DOB: 1965-05-27, 58 y.o.   MRN: [id number]  HPI Cody [name] is a 58 year old male with a past medical history of small adenomatous colon polyps, family history of colon cancer in his father's twin brother, history of internal hemorrhoids and anal fissure, chronic loose stools who is here for follow-up.  He was seen a couple months ago by [employer], PA-C.  He reports that he is feeling considerably better with the initiation of Benefiber 2 tablespoons twice daily.  Bowel habits have gone from 4-6 a day to 1-2 a day and formed.  He has had less perianal irritation with Desitin use.  He did have 2 episodes of a harder knobby stool where he had discomfort with passing the stool and he stated he knew before he looked that he would see red blood which was present on those 2 occasions only.  He did use topical diltiazem  gel for a week or 2 after seeing [facility] and the discovery of the fissures.  No abdominal or upper GI complaints today.   Review of Systems As per HPI, otherwise negative  Current Medications, Allergies, Past Medical History, Past Surgical History, Family History and Social History were reviewed in [employer] record.    Objective:   Physical Exam BP 94/76 (BP Location: Left Arm, Patient Position: Sitting, Cuff Size: Large)   Pulse 76   Ht 5' 6 (1.676 m)   Wt 222 lb 4 oz (100.8 kg)   BMI 35.87 kg/m  Gen: awake, alert, NAD HEENT: anicteric  Abd: soft, NT/ND, +BS throughout Rectal: Minor excoriations on the anus consistent with small fissures; no external rash or external hemorrhoid; posterior fissure palpable by sentinel pile with mild tenderness; no mass palpable Ext: no c/c/e Neuro: nonfocal     Assessment & Plan:  58 year old male with a past medical history of small adenomatous colon polyps, family history of colon cancer in his father's twin brother, history of internal hemorrhoids and  anal fissure, chronic loose stools who is here for follow-up.   Chronic loose stools --significant improvement with addition of Benefiber -- Continue Benefiber 2 tablespoons twice daily  2.  Anal fissure/hemorrhoid/perianal discomfort and irritation --based on his symptoms and improvement I do not think hemorrhoidal banding will provide additional benefit today.  I do think if he uses creamy Desitin he can have less perianal discomfort, improved excoriation and reduce the risk of fungal itching due to the antifungal effect of zinc.  His anal fissure symptoms we discussed and he can use as needed diltiazem .  3.  Personal history of small adenomas of the colon/family history of colon cancer in uncle --his father was a twin and it was his twin that had colon cancer before age 52.  His father has had polyps but no cancer.  Based on his family and personal history 5-year surveillance interval for colonoscopy is reasonable -- Change recall colonoscopy to 5 years which for him would be January 2026; he is in full agreement with this plan  30 minutes total spent today including patient facing time, coordination of care, reviewing medical history/procedures/pertinent radiology studies, and documentation of the encounter.

## 2023-03-21 DIAGNOSIS — Z808 Family history of malignant neoplasm of other organs or systems: Secondary | ICD-10-CM | POA: Diagnosis not present

## 2023-03-21 DIAGNOSIS — L814 Other melanin hyperpigmentation: Secondary | ICD-10-CM | POA: Diagnosis not present

## 2023-03-21 DIAGNOSIS — L821 Other seborrheic keratosis: Secondary | ICD-10-CM | POA: Diagnosis not present

## 2023-03-21 DIAGNOSIS — L723 Sebaceous cyst: Secondary | ICD-10-CM | POA: Diagnosis not present

## 2023-03-21 DIAGNOSIS — D225 Melanocytic nevi of trunk: Secondary | ICD-10-CM | POA: Diagnosis not present

## 2023-03-21 DIAGNOSIS — L578 Other skin changes due to chronic exposure to nonionizing radiation: Secondary | ICD-10-CM | POA: Diagnosis not present

## 2023-03-22 ENCOUNTER — Encounter: Payer: Self-pay | Admitting: Family Medicine

## 2023-03-28 DIAGNOSIS — M7918 Myalgia, other site: Secondary | ICD-10-CM | POA: Diagnosis not present

## 2023-03-28 DIAGNOSIS — G5781 Other specified mononeuropathies of right lower limb: Secondary | ICD-10-CM | POA: Diagnosis not present

## 2023-03-28 DIAGNOSIS — M25561 Pain in right knee: Secondary | ICD-10-CM | POA: Diagnosis not present

## 2023-04-10 ENCOUNTER — Encounter: Payer: Commercial Managed Care - PPO | Admitting: Internal Medicine

## 2023-05-08 ENCOUNTER — Ambulatory Visit: Payer: Commercial Managed Care - PPO | Admitting: Allergy

## 2023-05-10 ENCOUNTER — Other Ambulatory Visit (HOSPITAL_BASED_OUTPATIENT_CLINIC_OR_DEPARTMENT_OTHER): Payer: Self-pay

## 2023-05-13 ENCOUNTER — Encounter: Payer: Self-pay | Admitting: Family Medicine

## 2023-06-06 DIAGNOSIS — M25561 Pain in right knee: Secondary | ICD-10-CM | POA: Diagnosis not present

## 2023-06-06 DIAGNOSIS — G5781 Other specified mononeuropathies of right lower limb: Secondary | ICD-10-CM | POA: Diagnosis not present

## 2023-06-27 DIAGNOSIS — M25561 Pain in right knee: Secondary | ICD-10-CM | POA: Diagnosis not present

## 2023-07-05 DIAGNOSIS — S83241A Other tear of medial meniscus, current injury, right knee, initial encounter: Secondary | ICD-10-CM | POA: Diagnosis not present

## 2023-07-05 DIAGNOSIS — G5781 Other specified mononeuropathies of right lower limb: Secondary | ICD-10-CM | POA: Diagnosis not present

## 2023-07-25 NOTE — Progress Notes (Signed)
 [facility] [address] [place] [address] Phone: [phone] Subjective:   Cody [name], am serving as a scribe for Dr. Ronnell Coins.  I'm seeing this patient by the request  of:  [name], [name], MD  CC: right knee pain   BJY:NWGNFAOZHY  TERI DILTZ is a 58 y.o. male coming in with complaint of R knee pain. PRP 03/07/2023. Symptoms improved for 6 weeks following injection. Patient states that he has lost 35#. MRI 06/27/2023 medial meniscus tear. Painful over meidal joint line. Going         Past Medical History:  Diagnosis Date   Claustrophobia    Complication of anesthesia    dizzy, difficult time waking up   Hemorrhoid 01/2019   external   Medical history non-contributory    Past Surgical History:  Procedure Laterality Date   HERNIA REPAIR     umbicial   LAPAROSCOPIC CHOLECYSTECTOMY     NECK SURGERY     as a child- patient is not sure what itwas- had cat scratch fever   RADIOLOGY WITH ANESTHESIA N/A 01/13/2016   Procedure: MRI of brain with and without and cervical spine and lumbar spine;  Surgeon: Medication Radiologist, MD;  Location: [facility] OR;  Service: Radiology;  Laterality: N/A;   RADIOLOGY WITH ANESTHESIA N/A 01/21/2019   Procedure: MRI WITH ANESTHESIA CERVICAL SPINE WITHOUT IV CONTRAST;  Surgeon: Radiologist, Medication, MD;  Location: [facility] OR;  Service: Radiology;  Laterality: N/A;   RADIOLOGY WITH ANESTHESIA N/A 09/20/2021   Procedure: MRI WITH ANESTHESIA LUMBAR SPINE WITHOUT CONTRAST;  Surgeon: Radiologist, Medication, MD;  Location: [facility] OR;  Service: Radiology;  Laterality: N/A;   Social History   Socioeconomic History   Marital status: Married    Spouse name: Not on file   Number of children: Not on file   Years of education: Not on file   Highest education level: Not on file  Occupational History   Not on file  Tobacco Use   Smoking status: Never   Smokeless tobacco: Never  Vaping Use   Vaping status: Never  Used  Substance and Sexual Activity   Alcohol use: Yes    Comment: occassional   Drug use: No   Sexual activity: Yes  Other Topics Concern   Not on file  Social History Narrative   Not on file   Social Drivers of Health   Financial Resource Strain: Not on file  Food Insecurity: Not on file  Transportation Needs: Not on file  Physical Activity: Not on file  Stress: Not on file  Social Connections: Not on file   No Known Allergies Family History  Adopted: Yes  Problem Relation Age of Onset   Colon polyps Father    Cancer Paternal Uncle        colon at 73.   Colon cancer Paternal Uncle    Esophageal cancer Neg Hx    Rectal cancer Neg Hx    Stomach cancer Neg Hx     Current Outpatient Medications (Endocrine & Metabolic):    predniSONE  (DELTASONE ) 20 MG tablet, Take 1 tablet (20 mg total) by mouth daily with breakfast.      Current Outpatient Medications (Other):    AMBULATORY NON FORMULARY MEDICATION, Medication Name: Diltiazem  Gel 2% Use Pea sized amount three times a day for 6-8 weeks to rectal area   dicyclomine  (BENTYL ) 10 MG capsule, Take 1 capsule (10 mg total) by mouth 3 (three) times daily before meals.  Wheat Dextrin (BENEFIBER PO), Take 1 Dose by mouth 2 (two) times daily.   gabapentin  (NEURONTIN ) 300 MG capsule, Take 1 capsule (300 mg total) by mouth at bedtime.   Reviewed prior external information including notes and imaging from  primary care provider As well as notes that were available from care everywhere and other healthcare systems.  Past medical history, social, surgical and family history all reviewed in electronic medical record.  No pertanent information unless stated regarding to the chief complaint.   Review of Systems:  No headache, visual changes, nausea, vomiting, diarrhea, constipation, dizziness, abdominal pain, skin rash, fevers, chills, night sweats, weight loss, swollen lymph nodes, body aches, joint swelling, chest pain, shortness  of breath, mood changes. POSITIVE muscle aches  Objective  Blood pressure 102/64, pulse 68, height 5' 6 (1.676 m), SpO2 98%.   General: No apparent distress alert and oriented x3 mood and affect normal, dressed appropriately.  HEENT: Pupils equal, extraocular movements intact  Respiratory: Patient's speak in full sentences and does not appear short of breath  Cardiovascular: No lower extremity edema, non tender, no erythema  Right knee exam shows tenderness to palpation over the medial joint line.  Patient does have a positive McMurray's noted. Patient does have no tenderness over the Pez anserine area.  Limited muscular skeletal ultrasound was performed and interpreted by Ronnell Coins, M  Patient does have hypoechoic changes of the medial meniscus noted.  Postsurgical changes noted as well.  No cortical irregularity noted other than some mild narrowing noted of the joint space.  Adductor muscle group appears to be fairly unremarkable.   Impression and Recommendations:    The above documentation has been reviewed and is accurate and complete Magen Suriano M Jerrianne Hartin, DO

## 2023-07-26 ENCOUNTER — Encounter: Payer: Self-pay | Admitting: Family Medicine

## 2023-07-26 ENCOUNTER — Ambulatory Visit: Admitting: Family Medicine

## 2023-07-26 ENCOUNTER — Other Ambulatory Visit: Payer: Self-pay

## 2023-07-26 ENCOUNTER — Other Ambulatory Visit (HOSPITAL_BASED_OUTPATIENT_CLINIC_OR_DEPARTMENT_OTHER): Payer: Self-pay

## 2023-07-26 VITALS — BP 102/64 | HR 68 | Ht 66.0 in

## 2023-07-26 DIAGNOSIS — M25561 Pain in right knee: Secondary | ICD-10-CM | POA: Diagnosis not present

## 2023-07-26 DIAGNOSIS — S83241A Other tear of medial meniscus, current injury, right knee, initial encounter: Secondary | ICD-10-CM | POA: Diagnosis not present

## 2023-07-26 MED ORDER — PREDNISONE 20 MG PO TABS
20.0000 mg | ORAL_TABLET | Freq: Every day | ORAL | 0 refills | Status: DC
  Filled 2023-07-26: qty 20, 20d supply, fill #0

## 2023-07-26 MED ORDER — GABAPENTIN 300 MG PO CAPS
300.0000 mg | ORAL_CAPSULE | Freq: Every day | ORAL | 1 refills | Status: DC
  Filled 2023-07-26 – 2023-08-03 (×2): qty 90, 90d supply, fill #0

## 2023-07-26 NOTE — Patient Instructions (Addendum)
 Gabapentin  refilled Prednisone  20mg  for trip Good to see you!

## 2023-07-26 NOTE — Assessment & Plan Note (Addendum)
 Patient does have a new complex tear noted on the MRI and on ultrasound does have some shadowing noted.  Patient at this point does not think that PRP will do enough improvement and with a complex tear this is likely the right answer.  Patient recently did have an injection that I do think decreased hypoechoic changes in the knee at the moment.  Continues to have a positive McMurray's.  Still can only do walking and has ideas to increase activity that he is unable to do right now secondary to his knee.  Surgical intervention will be necessary.  Patient will go back to the orthopedic surgeon to discuss further.  Total time reviewing patient's imaging and discussing with patient 32 minutes.

## 2023-07-26 NOTE — Assessment & Plan Note (Signed)
 Right knee does have some tenderness to palpation over the medial joint space.  Some tenderness laterally does continue to give him difficulty.  Most recent MRI did show medial meniscal tear and given an injection by a another provider.  Patient has had

## 2023-07-27 ENCOUNTER — Other Ambulatory Visit (HOSPITAL_BASED_OUTPATIENT_CLINIC_OR_DEPARTMENT_OTHER): Payer: Self-pay

## 2023-08-03 ENCOUNTER — Other Ambulatory Visit (HOSPITAL_BASED_OUTPATIENT_CLINIC_OR_DEPARTMENT_OTHER): Payer: Self-pay

## 2023-08-03 ENCOUNTER — Other Ambulatory Visit: Payer: Self-pay

## 2023-08-08 DIAGNOSIS — Z9889 Other specified postprocedural states: Secondary | ICD-10-CM | POA: Diagnosis not present

## 2023-08-08 DIAGNOSIS — G5781 Other specified mononeuropathies of right lower limb: Secondary | ICD-10-CM | POA: Diagnosis not present

## 2023-09-12 DIAGNOSIS — M25561 Pain in right knee: Secondary | ICD-10-CM | POA: Diagnosis not present

## 2023-09-12 DIAGNOSIS — G5781 Other specified mononeuropathies of right lower limb: Secondary | ICD-10-CM | POA: Diagnosis not present

## 2023-10-08 DIAGNOSIS — M25561 Pain in right knee: Secondary | ICD-10-CM | POA: Diagnosis not present

## 2023-10-29 ENCOUNTER — Encounter: Payer: Self-pay | Admitting: Family Medicine

## 2023-10-29 ENCOUNTER — Encounter: Admitting: Family Medicine

## 2023-10-29 ENCOUNTER — Other Ambulatory Visit (HOSPITAL_BASED_OUTPATIENT_CLINIC_OR_DEPARTMENT_OTHER): Payer: Self-pay

## 2023-10-29 ENCOUNTER — Ambulatory Visit: Admitting: Family Medicine

## 2023-10-29 VITALS — BP 110/80 | HR 71 | Temp 97.3°F | Resp 16 | Ht 66.0 in | Wt 213.8 lb

## 2023-10-29 DIAGNOSIS — R030 Elevated blood-pressure reading, without diagnosis of hypertension: Secondary | ICD-10-CM

## 2023-10-29 DIAGNOSIS — E559 Vitamin D deficiency, unspecified: Secondary | ICD-10-CM

## 2023-10-29 DIAGNOSIS — M542 Cervicalgia: Secondary | ICD-10-CM | POA: Diagnosis not present

## 2023-10-29 DIAGNOSIS — Z1159 Encounter for screening for other viral diseases: Secondary | ICD-10-CM | POA: Diagnosis not present

## 2023-10-29 LAB — VITAMIN D 25 HYDROXY (VIT D DEFICIENCY, FRACTURES): VITD: 28.18 ng/mL — ABNORMAL LOW (ref 30.00–100.00)

## 2023-10-29 LAB — CBC WITH DIFFERENTIAL/PLATELET
Basophils Absolute: 0 K/uL (ref 0.0–0.1)
Basophils Relative: 0.6 % (ref 0.0–3.0)
Eosinophils Absolute: 0.3 K/uL (ref 0.0–0.7)
Eosinophils Relative: 4.7 % (ref 0.0–5.0)
HCT: 41.8 % (ref 39.0–52.0)
Hemoglobin: 14.4 g/dL (ref 13.0–17.0)
Lymphocytes Relative: 35.1 % (ref 12.0–46.0)
Lymphs Abs: 1.9 K/uL (ref 0.7–4.0)
MCHC: 34.4 g/dL (ref 30.0–36.0)
MCV: 87.1 fl (ref 78.0–100.0)
Monocytes Absolute: 0.3 K/uL (ref 0.1–1.0)
Monocytes Relative: 6.2 % (ref 3.0–12.0)
Neutro Abs: 2.9 K/uL (ref 1.4–7.7)
Neutrophils Relative %: 53.4 % (ref 43.0–77.0)
Platelets: 206 K/uL (ref 150.0–400.0)
RBC: 4.8 Mil/uL (ref 4.22–5.81)
RDW: 13.6 % (ref 11.5–15.5)
WBC: 5.4 K/uL (ref 4.0–10.5)

## 2023-10-29 LAB — COMPREHENSIVE METABOLIC PANEL WITH GFR
ALT: 20 U/L (ref 0–53)
AST: 18 U/L (ref 0–37)
Albumin: 4.5 g/dL (ref 3.5–5.2)
Alkaline Phosphatase: 51 U/L (ref 39–117)
BUN: 15 mg/dL (ref 6–23)
CO2: 26 meq/L (ref 19–32)
Calcium: 9.3 mg/dL (ref 8.4–10.5)
Chloride: 105 meq/L (ref 96–112)
Creatinine, Ser: 0.84 mg/dL (ref 0.40–1.50)
GFR: 97.68 mL/min (ref >60.00)
Glucose, Bld: 90 mg/dL (ref 70–99)
Potassium: 4 meq/L (ref 3.5–5.1)
Sodium: 139 meq/L (ref 135–145)
Total Bilirubin: 1 mg/dL (ref 0.2–1.2)
Total Protein: 7.4 g/dL (ref 6.0–8.3)

## 2023-10-29 LAB — TSH: TSH: 1.6 u[IU]/mL (ref 0.35–5.50)

## 2023-10-29 LAB — C-REACTIVE PROTEIN: CRP: <1 mg/dL (ref 0.5–20.0)

## 2023-10-29 MED ORDER — PREDNISONE 20 MG PO TABS
ORAL_TABLET | ORAL | 0 refills | Status: AC
  Filled 2023-10-29: qty 20, 12d supply, fill #0

## 2023-10-29 NOTE — Patient Instructions (Signed)
 A few things to remember from today's visit:  Encounter for HCV screening test for low risk patient - Plan: Hepatitis C antibody  Neck pain - Plan: C-reactive protein  Elevated blood pressure reading - Plan: Comprehensive metabolic panel with GFR, TSH, CBC with Differential/Platelet  Vitamin D  deficiency - Plan: VITAMIN D  25 Hydroxy (Vit-D Deficiency, Fractures)  Monitor for changes in small knot in neck. Blood pressure readings in 22 weeks.  If you need refills for medications you take chronically, please call your pharmacy. Do not use My Chart to request refills or for acute issues that need immediate attention. If you send a my chart message, it may take a few days to be addressed, specially if I am not in the office.  Please be sure medication list is accurate. If a new problem present, please set up appointment sooner than planned today.

## 2023-10-29 NOTE — Progress Notes (Unsigned)
 ACUTE VISIT Chief Complaint  Patient presents with   Blood Pressure Check    Pt concerns about his BP reading. In the last week, bp reading 145/88-85. Flunctuate to 122/80. Been feeling very flushed, hot. When feeling flush, high BP reading.  Feels something is off.  Pt would like to get blood work.    Fatigue   Mass    Pt c/o swollen lymp on back of neck. Been going on for 2-3 wk. Had history of neck issue with pinch nerve. Want to know if need steroid.    Hot Flashes   HPI: [name] is a 58 y.o. male, who is here today complaining of *** HPI Discussed the use of AI scribe software for clinical note transcription with the patient, who gave verbal consent to proceed.  History of Present Illness Cody [name] is a 58 year old male who presents with fluctuating blood pressure and facial flushing.  For the past month, he has experienced episodes of facial flushing and warmth without an increase in body temperature. These episodes are accompanied by fluctuations in blood pressure, with readings ranging from 150/85 mmHg to 120/80 mmHg within a short period. He reports that his blood pressure readings are sometimes higher upon waking and may decrease after physical activity, but the pattern is variable.  He experiences a persistent feeling of fatigue, which he attributes to poor sleep, averaging five to six hours per night. He has a history of anxiety, which he believes may contribute to his symptoms. No fever, chills, weight loss, or night sweats.  He has noticed a swollen lymph node at the back of his neck for the past two to three weeks, accompanied by neck stiffness. He has a history of cervical disc protrusion diagnosed in 2020, which he suspects may be related to his current neck symptoms. However, he denies any radiation of pain to the arms, numbness, or tingling.  He has a history of vertigo from 20 years ago, which has left him with residual dizziness. He also mentions a  recent weight gain of 10-12 pounds over the summer, despite previously maintaining a healthier diet. He is not currently taking vitamin D  supplements and has stopped taking gabapentin  three months ago, which he previously used for pain management.  He is retired and lives with his wife. He has a routine of walking three miles every morning.   Review of Systems See other pertinent positives and negatives in HPI.  Current Outpatient Medications on File Prior to Visit  Medication Sig Dispense Refill   AMBULATORY NON FORMULARY MEDICATION Medication Name: Diltiazem  Gel 2% Use Pea sized amount three times a day for 6-8 weeks to rectal area (Patient not taking: Reported on 10/29/2023) 30 g 1   dicyclomine  (BENTYL ) 10 MG capsule Take 1 capsule (10 mg total) by mouth 3 (three) times daily before meals. (Patient not taking: Reported on 10/29/2023) 90 capsule 0   gabapentin  (NEURONTIN ) 300 MG capsule Take 1 capsule (300 mg total) by mouth at bedtime. (Patient not taking: Reported on 10/29/2023) 90 capsule 1   predniSONE  (DELTASONE ) 20 MG tablet Take 1 tablet (20 mg total) by mouth daily with breakfast. (Patient not taking: Reported on 10/29/2023) 20 tablet 0   Wheat Dextrin (BENEFIBER PO) Take 1 Dose by mouth 2 (two) times daily. (Patient not taking: Reported on 10/29/2023)     No current facility-administered medications on file prior to visit.    Past Medical History:  Diagnosis Date   Claustrophobia  Complication of anesthesia    dizzy, difficult time waking up   Hemorrhoid 01/2019   external   Medical history non-contributory    No Known Allergies  Social History   Socioeconomic History   Marital status: Married    Spouse name: Not on file   Number of children: Not on file   Years of education: Not on file   Highest education level: Not on file  Occupational History   Not on file  Tobacco Use   Smoking status: Never   Smokeless tobacco: Never  Vaping Use   Vaping status: Never Used   Substance and Sexual Activity   Alcohol use: Yes    Comment: occassional   Drug use: No   Sexual activity: Yes  Other Topics Concern   Not on file  Social History Narrative   Not on file   Social Drivers of Health   Financial Resource Strain: Not on file  Food Insecurity: Not on file  Transportation Needs: Not on file  Physical Activity: Not on file  Stress: Not on file  Social Connections: Not on file    Vitals:   10/29/23 0836  BP: 110/80  Pulse: 71  Resp: 16  Temp: (!) 97.3 F (36.3 C)  SpO2: 97%   Body mass index is 34.51 kg/m.  Physical Exam Nursing note reviewed.  Constitutional:      General: He is not in acute distress.    Appearance: He is well-developed.  HENT:     Head: Normocephalic and atraumatic.  Eyes:     Conjunctiva/sclera: Conjunctivae normal.  Neck:     Thyroid : No thyroid  mass.      Comments: 3-4 mm *** Cardiovascular:     Rate and Rhythm: Normal rate and regular rhythm.     Pulses:          Dorsalis pedis pulses are 2+ on the right side and 2+ on the left side.     Heart sounds: No murmur heard. Pulmonary:     Effort: Pulmonary effort is normal. No respiratory distress.     Breath sounds: Normal breath sounds.  Abdominal:     Palpations: Abdomen is soft. There is no hepatomegaly or mass.     Tenderness: There is no abdominal tenderness.  Lymphadenopathy:     Cervical: No cervical adenopathy.  Skin:    General: Skin is warm.     Findings: No erythema or rash.  Neurological:     Mental Status: He is alert and oriented to person, place, and time.     Cranial Nerves: No cranial nerve deficit.     Gait: Gait normal.  Psychiatric:     Comments: Well groomed, good eye contact.     ASSESSMENT AND PLAN: There are no diagnoses linked to this encounter.  No follow-ups on file. Telang erythe cheeks *** ? Rosacea Lenix Kidd G. [name], MD  [facility]. [place] office.  Discharge Instructions   None

## 2023-10-30 ENCOUNTER — Ambulatory Visit: Payer: Self-pay | Admitting: Family Medicine

## 2023-10-30 LAB — HEPATITIS C ANTIBODY: Hepatitis C Ab: NONREACTIVE

## 2023-11-08 NOTE — Progress Notes (Signed)
 [facility] [address] [place] [address] Phone: [phone] Subjective:   ISusannah Gully, am serving as a scribe for Dr. Arthea Claudene.  I'm seeing this patient by the request  of:  [name], [name], MD  CC: Neck pain and knee pain.  YEP:Dlagzrupcz  Cody [name] is a 58 y.o. male coming in with complaint of neck pain. Knee surgery scheduled for 11/30/2023. Patient states neck has been more bothersome than usual. Bump in the back of the neck. More question about knee surgery.   Recent labs by primary care did show extremely low vitamin D .    Past Medical History:  Diagnosis Date   Claustrophobia    Complication of anesthesia    dizzy, difficult time waking up   Hemorrhoid 01/2019   external   Medical history non-contributory    Past Surgical History:  Procedure Laterality Date   HERNIA REPAIR     umbicial   LAPAROSCOPIC CHOLECYSTECTOMY     NECK SURGERY     as a child- patient is not sure what itwas- had cat scratch fever   RADIOLOGY WITH ANESTHESIA N/A 01/13/2016   Procedure: MRI of brain with and without and cervical spine and lumbar spine;  Surgeon: Medication Radiologist, MD;  Location: [facility] OR;  Service: Radiology;  Laterality: N/A;   RADIOLOGY WITH ANESTHESIA N/A 01/21/2019   Procedure: MRI WITH ANESTHESIA CERVICAL SPINE WITHOUT IV CONTRAST;  Surgeon: Radiologist, Medication, MD;  Location: [facility] OR;  Service: Radiology;  Laterality: N/A;   RADIOLOGY WITH ANESTHESIA N/A 09/20/2021   Procedure: MRI WITH ANESTHESIA LUMBAR SPINE WITHOUT CONTRAST;  Surgeon: Radiologist, Medication, MD;  Location: [facility] OR;  Service: Radiology;  Laterality: N/A;   Social History   Socioeconomic History   Marital status: Married    Spouse name: Not on file   Number of children: Not on file   Years of education: Not on file   Highest education level: Not on file  Occupational History   Not on file  Tobacco Use   Smoking status: Never   Smokeless  tobacco: Never  Vaping Use   Vaping status: Never Used  Substance and Sexual Activity   Alcohol use: Yes    Comment: occassional   Drug use: No   Sexual activity: Yes  Other Topics Concern   Not on file  Social History Narrative   Not on file   Social Drivers of Health   Financial Resource Strain: Not on file  Food Insecurity: Not on file  Transportation Needs: Not on file  Physical Activity: Not on file  Stress: Not on file  Social Connections: Not on file   No Known Allergies Family History  Adopted: Yes  Problem Relation Age of Onset   Colon polyps Father    Cancer Paternal Uncle        colon at 22.   Colon cancer Paternal Uncle    Esophageal cancer Neg Hx    Rectal cancer Neg Hx    Stomach cancer Neg Hx          Current Outpatient Medications (Other):    AMBULATORY NON FORMULARY MEDICATION, Medication Name: Diltiazem  Gel 2% Use Pea sized amount three times a day for 6-8 weeks to rectal area (Patient not taking: Reported on 10/29/2023)   dicyclomine  (BENTYL ) 10 MG capsule, Take 1 capsule (10 mg total) by mouth 3 (three) times daily before meals. (Patient not taking: Reported on 10/29/2023)   gabapentin  (NEURONTIN ) 300 MG capsule,  Take 1 capsule (300 mg total) by mouth at bedtime. (Patient not taking: Reported on 10/29/2023)   Wheat Dextrin (BENEFIBER PO), Take 1 Dose by mouth 2 (two) times daily. (Patient not taking: Reported on 10/29/2023)   Reviewed prior external information including notes and imaging from  primary care provider As well as notes that were available from care everywhere and other healthcare systems.  Past medical history, social, surgical and family history all reviewed in electronic medical record.  No pertanent information unless stated regarding to the chief complaint.   Review of Systems:  No headache, visual changes, nausea, vomiting, diarrhea, constipation, dizziness, abdominal pain, skin rash, fevers, chills, night sweats, weight loss,  swollen lymph nodes, body aches, joint swelling, chest pain, shortness of breath, mood changes. POSITIVE muscle aches  Objective  There were no vitals taken for this visit.   General: No apparent distress alert and oriented x3 mood and affect normal, dressed appropriately.  HEENT: Pupils equal, extraocular movements intact  Respiratory: Patient's speak in full sentences and does not appear short of breath  Cardiovascular: No lower extremity edema, non tender, no erythema  Neck exam shows patient does have some tightness noted on the right side of the neck.  Very small tender lymph node noted and does seem to have some shoddy lymph nodes in the posterior chain.  Does have some limitation of extension of the neck noted.  Knee exam shows patient does still have an antalgic gait and still tender over the medial aspect.   Limited muscular skeletal ultrasound was performed and interpreted by CLAUDENE HUSSAR, M  Limited ultrasound shows that the area where patient is the most tender that there is a lymph node noted.  Seems to be reactive with hypoechoic changes noted but seems to be reactive, some other shotty lymph nodes in the surrounding area consistent with a postviral or post bacterial infection etiology potentially.  No abnormal soft tissue is otherwise.   Impression and Recommendations:    The above documentation has been reviewed and is accurate and complete Cody [name] M Cody Duve, DO

## 2023-11-11 ENCOUNTER — Encounter: Payer: Self-pay | Admitting: Family Medicine

## 2023-11-13 ENCOUNTER — Other Ambulatory Visit (HOSPITAL_BASED_OUTPATIENT_CLINIC_OR_DEPARTMENT_OTHER): Payer: Self-pay

## 2023-11-13 ENCOUNTER — Other Ambulatory Visit: Payer: Self-pay

## 2023-11-13 ENCOUNTER — Encounter: Payer: Self-pay | Admitting: Family Medicine

## 2023-11-13 ENCOUNTER — Ambulatory Visit: Admitting: Family Medicine

## 2023-11-13 VITALS — BP 122/76 | HR 85 | Ht 66.0 in

## 2023-11-13 DIAGNOSIS — M542 Cervicalgia: Secondary | ICD-10-CM

## 2023-11-13 DIAGNOSIS — R599 Enlarged lymph nodes, unspecified: Secondary | ICD-10-CM | POA: Insufficient documentation

## 2023-11-13 MED ORDER — DOXYCYCLINE HYCLATE 100 MG PO TABS
100.0000 mg | ORAL_TABLET | Freq: Two times a day (BID) | ORAL | 0 refills | Status: AC
  Filled 2023-11-13: qty 14, 7d supply, fill #0

## 2023-11-13 NOTE — Assessment & Plan Note (Signed)
 Mild enlargement noted on the left side of the neck.  Tender to palpation, freely movable, on ultrasound appears to be a reactive lymph node.  Patient's most recent laboratory workup was unremarkable.  Do not think any further workup is necessary but because patient does have a surgery in the next 2 weeks we will treat with doxycycline  with patient having symptoms for 2 weeks but is improving.  Highly unlikely anything else is potentially contributing.  Follow-up with me again 6 weeks after surgery.

## 2023-11-13 NOTE — Patient Instructions (Signed)
 Prescription sent

## 2023-11-22 ENCOUNTER — Other Ambulatory Visit: Payer: Self-pay

## 2023-11-22 ENCOUNTER — Encounter (HOSPITAL_BASED_OUTPATIENT_CLINIC_OR_DEPARTMENT_OTHER): Payer: Self-pay | Admitting: Orthopedic Surgery

## 2023-11-30 ENCOUNTER — Other Ambulatory Visit (HOSPITAL_BASED_OUTPATIENT_CLINIC_OR_DEPARTMENT_OTHER): Payer: Self-pay

## 2023-11-30 ENCOUNTER — Ambulatory Visit (HOSPITAL_BASED_OUTPATIENT_CLINIC_OR_DEPARTMENT_OTHER)
Admission: RE | Admit: 2023-11-30 | Discharge: 2023-11-30 | Disposition: A | Discharge status: Home / Self Care | Attending: Orthopedic Surgery | Admitting: Orthopedic Surgery

## 2023-11-30 ENCOUNTER — Encounter (HOSPITAL_BASED_OUTPATIENT_CLINIC_OR_DEPARTMENT_OTHER): Payer: Self-pay | Admitting: Orthopedic Surgery

## 2023-11-30 ENCOUNTER — Ambulatory Visit (HOSPITAL_BASED_OUTPATIENT_CLINIC_OR_DEPARTMENT_OTHER): Admitting: Anesthesiology

## 2023-11-30 ENCOUNTER — Other Ambulatory Visit: Payer: Self-pay

## 2023-11-30 ENCOUNTER — Encounter (HOSPITAL_BASED_OUTPATIENT_CLINIC_OR_DEPARTMENT_OTHER)
Admission: RE | Disposition: A | Discharge status: Home / Self Care | Payer: Self-pay | Source: Home / Self Care | Attending: Orthopedic Surgery

## 2023-11-30 DIAGNOSIS — S83231A Complex tear of medial meniscus, current injury, right knee, initial encounter: Secondary | ICD-10-CM | POA: Diagnosis not present

## 2023-11-30 DIAGNOSIS — Z01818 Encounter for other preprocedural examination: Secondary | ICD-10-CM

## 2023-11-30 DIAGNOSIS — S83241D Other tear of medial meniscus, current injury, right knee, subsequent encounter: Secondary | ICD-10-CM | POA: Diagnosis not present

## 2023-11-30 DIAGNOSIS — F419 Anxiety disorder, unspecified: Secondary | ICD-10-CM | POA: Insufficient documentation

## 2023-11-30 DIAGNOSIS — M65961 Unspecified synovitis and tenosynovitis, right lower leg: Secondary | ICD-10-CM | POA: Insufficient documentation

## 2023-11-30 DIAGNOSIS — Z79899 Other long term (current) drug therapy: Secondary | ICD-10-CM | POA: Diagnosis not present

## 2023-11-30 DIAGNOSIS — X58XXXA Exposure to other specified factors, initial encounter: Secondary | ICD-10-CM | POA: Insufficient documentation

## 2023-11-30 DIAGNOSIS — M1990 Unspecified osteoarthritis, unspecified site: Secondary | ICD-10-CM | POA: Insufficient documentation

## 2023-11-30 DIAGNOSIS — S83241A Other tear of medial meniscus, current injury, right knee, initial encounter: Secondary | ICD-10-CM | POA: Diagnosis not present

## 2023-11-30 DIAGNOSIS — M65861 Other synovitis and tenosynovitis, right lower leg: Secondary | ICD-10-CM | POA: Diagnosis not present

## 2023-11-30 HISTORY — PX: KNEE ARTHROSCOPY WITH MEDIAL MENISECTOMY: SHX5651

## 2023-11-30 SURGERY — ARTHROSCOPY, KNEE, WITH MEDIAL MENISCECTOMY
Anesthesia: General | Site: Knee | Laterality: Right

## 2023-11-30 MED ORDER — LIDOCAINE HCL (CARDIAC) PF 100 MG/5ML IV SOSY
PREFILLED_SYRINGE | INTRAVENOUS | Status: DC | PRN
  Administered 2023-11-30: 100 mg via INTRAVENOUS

## 2023-11-30 MED ORDER — ONDANSETRON HCL 4 MG/2ML IJ SOLN
INTRAMUSCULAR | Status: DC | PRN
  Administered 2023-11-30: 4 mg via INTRAVENOUS

## 2023-11-30 MED ORDER — CEFAZOLIN SODIUM-DEXTROSE 2-4 GM/100ML-% IV SOLN
INTRAVENOUS | Status: AC
  Filled 2023-11-30: qty 100

## 2023-11-30 MED ORDER — CEFAZOLIN SODIUM-DEXTROSE 2-4 GM/100ML-% IV SOLN
2.0000 g | INTRAVENOUS | Status: AC
  Administered 2023-11-30: 2 g via INTRAVENOUS

## 2023-11-30 MED ORDER — FENTANYL CITRATE (PF) 100 MCG/2ML IJ SOLN
INTRAMUSCULAR | Status: AC
  Filled 2023-11-30: qty 2

## 2023-11-30 MED ORDER — KETOROLAC TROMETHAMINE 30 MG/ML IJ SOLN
INTRAMUSCULAR | Status: DC | PRN
  Administered 2023-11-30: 30 mg via INTRAVENOUS

## 2023-11-30 MED ORDER — DEXAMETHASONE SODIUM PHOSPHATE 4 MG/ML IJ SOLN
INTRAMUSCULAR | Status: DC | PRN
  Administered 2023-11-30: 5 mg via INTRAVENOUS

## 2023-11-30 MED ORDER — ONDANSETRON 4 MG PO TBDP
4.0000 mg | ORAL_TABLET | Freq: Three times a day (TID) | ORAL | 0 refills | Status: AC | PRN
  Filled 2023-11-30: qty 20, 7d supply, fill #0

## 2023-11-30 MED ORDER — OXYCODONE HCL 5 MG PO TABS: 5.0000 mg | ORAL_TABLET | Freq: Once | ORAL | Status: DC | PRN

## 2023-11-30 MED ORDER — FENTANYL CITRATE (PF) 100 MCG/2ML IJ SOLN: 25.0000 ug | INTRAMUSCULAR | Status: DC | PRN

## 2023-11-30 MED ORDER — EPINEPHRINE PF 1 MG/ML IJ SOLN
INTRAMUSCULAR | Status: AC
  Filled 2023-11-30: qty 1

## 2023-11-30 MED ORDER — ACETAMINOPHEN 10 MG/ML IV SOLN
INTRAVENOUS | Status: DC | PRN
  Administered 2023-11-30: 1000 mg via INTRAVENOUS

## 2023-11-30 MED ORDER — MIDAZOLAM HCL 2 MG/2ML IJ SOLN
INTRAMUSCULAR | Status: AC
  Filled 2023-11-30: qty 2

## 2023-11-30 MED ORDER — BUPIVACAINE HCL 0.25 % IJ SOLN
INTRAMUSCULAR | Status: DC | PRN
  Administered 2023-11-30: 9 mL

## 2023-11-30 MED ORDER — DROPERIDOL 2.5 MG/ML IJ SOLN: 0.6250 mg | Freq: Once | INTRAMUSCULAR | Status: DC | PRN

## 2023-11-30 MED ORDER — HYDROCODONE-ACETAMINOPHEN 10-325 MG PO TABS
1.0000 | ORAL_TABLET | Freq: Four times a day (QID) | ORAL | 0 refills | Status: AC | PRN
  Filled 2023-11-30: qty 20, 5d supply, fill #0

## 2023-11-30 MED ORDER — FENTANYL CITRATE (PF) 100 MCG/2ML IJ SOLN
INTRAMUSCULAR | Status: DC | PRN
  Administered 2023-11-30: 25 ug via INTRAVENOUS
  Administered 2023-11-30 (×2): 50 ug via INTRAVENOUS

## 2023-11-30 MED ORDER — CELECOXIB 200 MG PO CAPS: 200.0000 mg | ORAL_CAPSULE | Freq: Once | ORAL | Status: DC

## 2023-11-30 MED ORDER — DEXMEDETOMIDINE HCL IN NACL 80 MCG/20ML IV SOLN
INTRAVENOUS | Status: DC | PRN
  Administered 2023-11-30: 4 ug via INTRAVENOUS

## 2023-11-30 MED ORDER — MIDAZOLAM HCL (PF) 2 MG/2ML IJ SOLN
INTRAMUSCULAR | Status: DC | PRN
  Administered 2023-11-30: 2 mg via INTRAVENOUS

## 2023-11-30 MED ORDER — OXYCODONE HCL 5 MG/5ML PO SOLN: 5.0000 mg | Freq: Once | ORAL | Status: DC | PRN

## 2023-11-30 MED ORDER — PROPOFOL 500 MG/50ML IV EMUL
INTRAVENOUS | Status: AC
  Filled 2023-11-30: qty 100

## 2023-11-30 MED ORDER — ACETAMINOPHEN 500 MG PO TABS: 1000.0000 mg | ORAL_TABLET | Freq: Once | ORAL | Status: DC

## 2023-11-30 MED ORDER — LACTATED RINGERS IV SOLN: INTRAVENOUS | Status: DC

## 2023-11-30 MED ORDER — DEXMEDETOMIDINE HCL IN NACL 80 MCG/20ML IV SOLN
INTRAVENOUS | Status: AC
  Filled 2023-11-30: qty 20

## 2023-11-30 MED ORDER — EPINEPHRINE 1 MG/10ML IV SOSY
PREFILLED_SYRINGE | INTRAVENOUS | Status: DC | PRN
  Administered 2023-11-30: 1 mL via SUBCUTANEOUS

## 2023-11-30 MED ADMIN — PROPOFOL 200 MG/20ML IV EMUL: 180 mg | INTRAVENOUS | NDC 00069020910

## 2023-11-30 MED ADMIN — Sodium Chloride Irrigation Soln 0.9%: 1800 mL | NDC 00409613803

## 2023-11-30 MED FILL — Bupivacaine Inj 0.5% w/ Epinephrine 1:200000 (PF): INTRAMUSCULAR | Qty: 30 | Status: CN

## 2023-11-30 MED FILL — Bupivacaine HCl Preservative Free (PF) Inj 0.25%: INTRAMUSCULAR | Qty: 30 | Status: AC

## 2023-11-30 MED FILL — Acetaminophen IV Soln 10 MG/ML: INTRAVENOUS | Qty: 100 | Status: AC

## 2023-11-30 SURGICAL SUPPLY — 21 items
BLADE SHAVER TORPEDO 4X13 (MISCELLANEOUS) ×1 IMPLANT
BNDG ELASTIC 6INX 5YD STR LF (GAUZE/BANDAGES/DRESSINGS) ×1 IMPLANT
CLSR STERI-STRIP ANTIMIC 1/2X4 (GAUZE/BANDAGES/DRESSINGS) ×1 IMPLANT
DRAPE U-SHAPE 47X51 STRL (DRAPES) ×1 IMPLANT
DRAPE-T ARTHROSCOPY W/POUCH (DRAPES) ×1 IMPLANT
DURAPREP 26ML APPLICATOR (WOUND CARE) ×1 IMPLANT
GAUZE PAD ABD 8X10 STRL (GAUZE/BANDAGES/DRESSINGS) ×1 IMPLANT
GAUZE SPONGE 4X4 12PLY STRL (GAUZE/BANDAGES/DRESSINGS) ×1 IMPLANT
GLOVE BIO SURGEON STRL SZ7.5 (GLOVE) ×2 IMPLANT
GLOVE BIOGEL PI IND STRL 8 (GLOVE) ×2 IMPLANT
GOWN STRL REUS W/ TWL LRG LVL3 (GOWN DISPOSABLE) ×1 IMPLANT
GOWN STRL REUS W/TWL XL LVL3 (GOWN DISPOSABLE) ×2 IMPLANT
MANIFOLD NEPTUNE II (INSTRUMENTS) ×1 IMPLANT
PACK ARTHROSCOPY DSU (CUSTOM PROCEDURE TRAY) ×1 IMPLANT
PACK BASIN DAY SURGERY FS (CUSTOM PROCEDURE TRAY) ×1 IMPLANT
SUT ETHILON 4 0 PS 2 18 (SUTURE) IMPLANT
SUT MNCRL AB 3-0 PS2 18 (SUTURE) ×1 IMPLANT
TOWEL GREEN STERILE FF (TOWEL DISPOSABLE) ×1 IMPLANT
TUBE CONNECTING 20X1/4 (TUBING) IMPLANT
TUBING ARTHROSCOPY IRRIG 16FT (MISCELLANEOUS) ×1 IMPLANT
WAND ABLATOR APOLLO I90 (BUR) IMPLANT

## 2023-11-30 NOTE — Transfer of Care (Signed)
 Immediate Anesthesia Transfer of Care Note  Patient: Cody [name]  Procedure(s) Performed: ARTHROSCOPY, KNEE, WITH PARTIAL MEDIAL MENISCECTOMY (Right: Knee)  Patient Location: PACU  Anesthesia Type:General  Level of Consciousness: drowsy and patient cooperative  Airway & Oxygen Therapy: Patient Spontanous Breathing and Patient connected to nasal cannula oxygen  Post-op Assessment: Report given to RN and Post -op Vital signs reviewed and stable  Post vital signs: Reviewed and stable  Last Vitals:  Vitals Value Taken Time  BP 131/81 11/30/23 08:30  Temp 36.3 C 11/30/23 08:30  Pulse 63 11/30/23 08:30  Resp 16 11/30/23 08:30  SpO2 97 % 11/30/23 08:30    Last Pain:  Vitals:   11/30/23 9367  PainSc: 0-No pain         Complications: No notable events documented.

## 2023-11-30 NOTE — Anesthesia Procedure Notes (Signed)
 Procedure Name: LMA Insertion Date/Time: 11/30/2023 7:50 AM  Performed by: Donnell Berwyn SQUIBB, CRNAPre-anesthesia Checklist: Patient identified, Emergency Drugs available, Suction available, Patient being monitored and Timeout performed Patient Re-evaluated:Patient Re-evaluated prior to induction Oxygen Delivery Method: Circle system utilized Preoxygenation: Pre-oxygenation with 100% oxygen Induction Type: IV induction Ventilation: Mask ventilation without difficulty LMA: LMA inserted LMA Size: 5.0 Number of attempts: 1 Placement Confirmation: positive ETCO2 and breath sounds checked- equal and bilateral Tube secured with: Tape Dental Injury: Teeth and Oropharynx as per pre-operative assessment

## 2023-11-30 NOTE — Op Note (Addendum)
 Surgery Date: 11/30/2023  Surgeon(s): Sharl Selinda Dover, MD  Assistant:  Dayle Moores, PA-C  Assistant attestation: PA Mcclung scrubbed and present for the entire procedure.  ANESTHESIA:  general, local with quarter percent plain Marcaine  FLUIDS: Per anesthesia record.   ESTIMATED BLOOD LOSS: minimal  PREOPERATIVE DIAGNOSES:  1.  Right knee complex medial meniscus tear 2.  knee synovitis  POSTOPERATIVE DIAGNOSES:  same  PROCEDURES PERFORMED:  1.  Right knee arthroscopy with limited synovectomy 2.  Right knee arthroscopy with arthroscopic partial medial meniscectomy   DESCRIPTION OF PROCEDURE: Mr. Briski is a 58 y.o.-year-old male with Right knee complex medial meniscus tear. Plans are to proceed with partial right medial meniscectomy and diagnostic arthroscopy with debridement as indicated. Full discussion held regarding risks benefits alternatives and complications related surgical intervention. Conservative care options reviewed. All questions answered.  The patient was identified in the preoperative holding area and the operative extremity was marked. The patient was brought to the operating room and transferred to operating table in a supine position. Satisfactory general anesthesia was induced by anesthesiology.    Standard anterolateral, anteromedial arthroscopy portals were obtained. The anteromedial portal was obtained with a spinal needle for localization under direct visualization with subsequent diagnostic findings.   Anteromedial and anterolateral chambers: mild synovitis. The synovitis was debrided with a 4.5 mm full radius shaver through both the anteromedial and lateral portals.   Suprapatellar pouch and gutters: no synovitis or debris. Patella chondral surface: Grade 1 Trochlear chondral surface: Grade 0 Patellofemoral tracking: Midline and level Medial meniscus: Complex tear in the mid body extending back to the posterior horn.  This was a horizontal tear  at the mid body and then right at the junction of the mid body and posterior horn he had a radial tear that was completely through from the free edge of the meniscus all the way to the capsule.  This was also unstable..  Medial femoral condyle flexion bearing surface: Grade 1 Medial femoral condyle extension bearing surface: Grade 2 Medial tibial plateau: Grade 0 Anterior cruciate ligament:stable Posterior cruciate ligament:stable Lateral meniscus: Intact without tear.   Lateral femoral condyle flexion bearing surface: Grade 0 Lateral femoral condyle extension bearing surface: Grade 0 Lateral tibial plateau: Grade 0  Medial meniscectomy was carried out with combination of meniscal biter and motorized shaver.  We resected the unstable radial component at a 45 degree angle from the posterior horn to the capsule and then likewise moving anterior.  However, anteriorly there was a horizontal tear that had an unstable inferior and superior leaflet due to that radial component.  This was likewise resected back to stable tissue as it moved more anterior.  Total resected surface area was approximately 30%.  After completion of synovectomy, diagnostic exam, and debridements as described, all compartments were checked and no residual debris remained. Hemostasis was achieved with the cautery wand. The portals were approximated with buried monocryl. All excess fluid was expressed from the joint.  Xeroform sterile gauze dressings were applied followed by Ace bandage and ice pack.   DISPOSITION: The patient was awakened from general anesthetic, extubated, taken to the recovery room in medically stable condition, no apparent complications. The patient may be weightbearing as tolerated to the operative lower extremity.  Range of motion of right knee as tolerated.

## 2023-11-30 NOTE — Brief Op Note (Signed)
 11/30/2023  8:28 AM  PATIENT:  Cody [name]  58 y.o. male  PRE-OPERATIVE DIAGNOSIS:  Right knee medial meniscus tear  POST-OPERATIVE DIAGNOSIS:  Right knee medial meniscus tear  PROCEDURE:  Procedure(s) with comments: ARTHROSCOPY, KNEE, WITH PARTIAL MEDIAL MENISCECTOMY (Right) - Right knee arthroscopy with partial medial menisectomy  SURGEON:  Surgeons and Role:    * Sharl Selinda Dover, MD - Primary  PHYSICIAN ASSISTANT: Dayle Moores, PA-C   ANESTHESIA:   local and general  EBL:  20 mL   BLOOD ADMINISTERED:none  DRAINS: none   LOCAL MEDICATIONS USED:  MARCAINE     SPECIMEN:  No Specimen  DISPOSITION OF SPECIMEN:  N/A  COUNTS:  YES  TOURNIQUET:  * No tourniquets in log *  DICTATION: .Note written in EPIC  PLAN OF CARE: Discharge to home after PACU  PATIENT DISPOSITION:  PACU - hemodynamically stable.   Delay start of Pharmacological VTE agent (>24hrs) due to surgical blood loss or risk of bleeding: not applicable

## 2023-11-30 NOTE — H&P (Signed)
 ORTHOPAEDIC H and P  REQUESTING PHYSICIAN: Sharl Selinda Dover, MD  PCP:  [name], [name], MD  Chief Complaint: Right knee pain  HPI: Cody [name] is a 58 y.o. male who complains of right knee pain and mechanical symptoms.  Here today for arthroscopy.  Past Medical History:  Diagnosis Date   Claustrophobia    Complication of anesthesia    dizzy, difficult time waking up   Hemorrhoid 01/2019   external   Medical history non-contributory    Past Surgical History:  Procedure Laterality Date   HERNIA REPAIR     umbicial   LAPAROSCOPIC CHOLECYSTECTOMY     NECK SURGERY     as a child- patient is not sure what itwas- had cat scratch fever   RADIOLOGY WITH ANESTHESIA N/A 01/13/2016   Procedure: MRI of brain with and without and cervical spine and lumbar spine;  Surgeon: Medication Radiologist, MD;  Location: [facility] OR;  Service: Radiology;  Laterality: N/A;   RADIOLOGY WITH ANESTHESIA N/A 01/21/2019   Procedure: MRI WITH ANESTHESIA CERVICAL SPINE WITHOUT IV CONTRAST;  Surgeon: Radiologist, Medication, MD;  Location: [facility] OR;  Service: Radiology;  Laterality: N/A;   RADIOLOGY WITH ANESTHESIA N/A 09/20/2021   Procedure: MRI WITH ANESTHESIA LUMBAR SPINE WITHOUT CONTRAST;  Surgeon: Radiologist, Medication, MD;  Location: [facility] OR;  Service: Radiology;  Laterality: N/A;   Social History   Socioeconomic History   Marital status: Married    Spouse name: Not on file   Number of children: Not on file   Years of education: Not on file   Highest education level: Not on file  Occupational History   Not on file  Tobacco Use   Smoking status: Never   Smokeless tobacco: Never  Vaping Use   Vaping status: Never Used  Substance and Sexual Activity   Alcohol use: Yes    Comment: occassional   Drug use: No   Sexual activity: Yes  Other Topics Concern   Not on file  Social History Narrative   Not on file   Social Drivers of Health   Financial Resource Strain: Not on file  Food  Insecurity: Not on file  Transportation Needs: Not on file  Physical Activity: Not on file  Stress: Not on file  Social Connections: Not on file   Family History  Adopted: Yes  Problem Relation Age of Onset   Colon polyps Father    Cancer Paternal Uncle        colon at 73.   Colon cancer Paternal Uncle    Esophageal cancer Neg Hx    Rectal cancer Neg Hx    Stomach cancer Neg Hx    No Known Allergies Prior to Admission medications   Medication Sig Start Date End Date Taking? Authorizing Provider  Multiple Vitamin (MULTIVITAMIN ADULT PO) Take by mouth.   Yes [provider]  VITAMIN D  PO Take by mouth.   Yes [provider]  AMBULATORY NON FORMULARY MEDICATION Medication Name: Diltiazem  Gel 2% Use Pea sized amount three times a day for 6-8 weeks to rectal area Patient not taking: Reported on 10/29/2023 01/08/23   Mollie Nestor HERO, PA-C  dicyclomine  (BENTYL ) 10 MG capsule Take 1 capsule (10 mg total) by mouth 3 (three) times daily before meals. Patient not taking: Reported on 10/29/2023 01/08/23   Mollie Nestor HERO, PA-C  doxycycline  (VIBRA -TABS) 100 MG tablet Take 1 tablet (100 mg total) by mouth 2 (two) times daily. 11/13/23   Smith, Zachary M, DO  No results found.  Positive ROS: All other systems have been reviewed and were otherwise negative with the exception of those mentioned in the HPI and as above.  Physical Exam: General: Alert, no acute distress Cardiovascular: No pedal edema Respiratory: No cyanosis, no use of accessory musculature GI: No organomegaly, abdomen is soft and non-tender Skin: No lesions in the area of chief complaint Neurologic: Sensation intact distally Psychiatric: Patient is competent for consent with normal mood and affect Lymphatic: No axillary or cervical lymphadenopathy  MUSCULOSKELETAL: RLE-  wwp nvi  Assessment: Right knee medial meniscus tear  Plan: - today we will proceed with arthroscopy and partial medial  meniscectomy.  - The risks, benefits, and alternatives were discussed with the patient. There are risks associated with the surgery including, but not limited to, problems with anesthesia (death), infection,   hematoma (blood accumulation) which may require surgical drainage, blood clots, pulmonary embolism, nerve injury (foot drop), and blood vessel injury. The patient understands these risks and elects to proceed.  - dc home post op    Selinda Belvie Gosling, MD Cell [phone]    11/30/2023 6:48 AM

## 2023-11-30 NOTE — Discharge Instructions (Addendum)
 Post-operative patient instructions  Knee Arthroscopy   Ice:  Place intermittent ice or cooler pack over your knee, 30 minutes on and 30 minutes off.  Continue this for the first 72 hours after surgery, then save ice for use after therapy sessions or on more active days.   Weight:  You may bear weight on your leg as your symptoms allow. DVT prevention: Perform ankle pumps as able throughout the day on the operative extremity.  Be mobile as possible with ambulation as able.  You should also take an 81 mg aspirin once per day x6 weeks. Strengthening:  Perform simple thigh squeezes (isometric quad contractions) and straight leg lifts as you are able (3 sets of 5 to 10 repetitions, 3 times a day).  For the leg lifts, have someone support under your ankle in the beginning until you have increased strength enough to do this on your own.  To help get started on thigh squeezes, place a pillow under your knee and push down on the pillow with back of knee (sometimes easier to do than with your leg fully straight). Motion:  Perform gentle knee motion as tolerated - this is gentle bending and straightening of the knee. Seated heel slides: you can start by sitting in a chair, remove your brace, and gently slide your heel back on the floor - allowing your knee to bend. Have someone help you straighten your knee (or use your other leg/foot hooked under your ankle.  Dressing:  Perform 1st dressing change at 3 days postoperative. A moderate amount of blood tinged drainage is to be expected.  So if you bleed through the dressing on the first or second day or if you have fevers, it is fine to change the dressing/check the wounds early and redress wound. Elevate your leg.  If it bleeds through again, or if the incisions are leaking frank blood, please call the office. May change dressing every 1-2 days thereafter to help watch wounds. Can purchase Tegderm (or 5M Nexcare) water resistant dressings at local pharmacy /  [employer]. Shower:  Light shower is ok after 3 days.  Please take shower, NO bath. Recover with gauze and ace wrap to help keep wounds protected.   Pain medication:  A narcotic pain medication has been prescribed.  Take as directed.  Typically you need narcotic pain medication more regularly during the first 3 to 5 days after surgery.  Decrease your use of the medication as the pain improves.  Narcotics can sometimes cause constipation, even after a few doses.  If you have problems with constipation, you can take an over the counter stool softener or light laxative.  If you have persistent problems, please notify your physician's office. Physical therapy: Additional activity guidelines to be provided by your physician or physical therapist at follow-up visits.  Driving: Do not recommend driving x 1-2 weeks post surgical, especially if surgery performed on right side. Should not drive while taking narcotic pain medications. It typically takes at least 2 weeks to restore sufficient neuromuscular function for normal reaction times for driving safety.  Call [phone] for questions or problems. Evenings you will be forwarded to the hospital operator.  Ask for the orthopaedic physician on call. Please call if you experience:    Redness, foul smelling, or persistent drainage from the surgical site  worsening knee pain and swelling not responsive to medication  any calf pain and or swelling of the lower leg  temperatures greater than 101.5 F other questions or concerns  Thank you for allowing us  to be a part of your care    Post Anesthesia Home Care Instructions  Activity: Get plenty of rest for the remainder of the day. A responsible individual must stay with you for 24 hours following the procedure.  For the next 24 hours, DO NOT: -Drive a car -Advertising copywriter -Drink alcoholic beverages -Take any medication unless instructed by your physician -Make any legal decisions or sign important  papers.  Meals: Start with liquid foods such as gelatin or soup. Progress to regular foods as tolerated. Avoid greasy, spicy, heavy foods. If nausea and/or vomiting occur, drink only clear liquids until the nausea and/or vomiting subsides. Call your physician if vomiting continues.  Special Instructions/Symptoms: Your throat may feel dry or sore from the anesthesia or the breathing tube placed in your throat during surgery. If this causes discomfort, gargle with warm salt water. The discomfort should disappear within 24 hours.  If you had a scopolamine  patch placed behind your ear for the management of post- operative nausea and/or vomiting:  1. The medication in the patch is effective for 72 hours, after which it should be removed.  Wrap patch in a tissue and discard in the trash. Wash hands thoroughly with soap and water. 2. You may remove the patch earlier than 72 hours if you experience unpleasant side effects which may include dry mouth, dizziness or visual disturbances. 3. Avoid touching the patch. Wash your hands with soap and water after contact with the patch.  No tylenol  until after 2pm today, no ibuprofen until after 4pm.

## 2023-11-30 NOTE — Anesthesia Postprocedure Evaluation (Signed)
 Anesthesia Post Note  Patient: JIHAAD BRUSCHI  Procedure(s) Performed: ARTHROSCOPY, KNEE, WITH PARTIAL MEDIAL MENISCECTOMY (Right: Knee)     Patient location during evaluation: PACU Anesthesia Type: General Level of consciousness: awake and alert Pain management: pain level controlled Vital Signs Assessment: post-procedure vital signs reviewed and stable Respiratory status: spontaneous breathing, nonlabored ventilation, respiratory function stable and patient connected to nasal cannula oxygen Cardiovascular status: blood pressure returned to baseline and stable Postop Assessment: no apparent nausea or vomiting Anesthetic complications: no   No notable events documented.  Last Vitals:  Vitals:   11/30/23 0900 11/30/23 0915  BP: 126/78 131/88  Pulse: 64 63  Resp: 12 16  Temp:  (!) 36.3 C  SpO2: 90% 97%    Last Pain:  Vitals:   11/30/23 0915  PainSc: 6                  Wilhelmena Zea P Kylin Dubs

## 2023-11-30 NOTE — Anesthesia Preprocedure Evaluation (Addendum)
 Anesthesia Evaluation  Patient identified by MRN, date of birth, ID band Patient awake    Airway Mallampati: II  TM Distance: >3 FB Neck ROM: Full    Dental no notable dental hx.    Pulmonary    Pulmonary exam normal        Cardiovascular negative cardio ROS  Rhythm:Regular Rate:Normal     Neuro/Psych   Anxiety     negative neurological ROS     GI/Hepatic negative GI ROS, Neg liver ROS,,,  Endo/Other  negative endocrine ROS    Renal/GU negative Renal ROS  negative genitourinary   Musculoskeletal  (+) Arthritis ,    Abdominal Normal abdominal exam  (+)   Peds  Hematology   Anesthesia Other Findings   Reproductive/Obstetrics                              Anesthesia Physical Anesthesia Plan  ASA: 2  Anesthesia Plan: General   Post-op Pain Management: Tylenol  PO (pre-op)* and Celebrex  PO (pre-op)*   Induction: Intravenous  PONV Risk Score and Plan: 2 and Ondansetron , Dexamethasone , Midazolam  and Treatment may vary due to age or medical condition  Airway Management Planned: Mask and LMA  Additional Equipment: None  Intra-op Plan:   Post-operative Plan: Extubation in OR  Informed Consent: I have reviewed the patients History and Physical, chart, labs and discussed the procedure including the risks, benefits and alternatives for the proposed anesthesia with the patient or authorized representative who has indicated his/her understanding and acceptance.     Dental advisory given  Plan Discussed with: CRNA  Anesthesia Plan Comments:         Anesthesia Quick Evaluation

## 2023-12-01 ENCOUNTER — Encounter (HOSPITAL_BASED_OUTPATIENT_CLINIC_OR_DEPARTMENT_OTHER): Payer: Self-pay | Admitting: Orthopedic Surgery

## 2024-02-19 ENCOUNTER — Ambulatory Visit: Payer: Self-pay

## 2024-02-19 NOTE — Telephone Encounter (Signed)
 FYI Only or Action Required?: Action required by provider: Refusing disposition, limited info provided by pt, needs call back.  Patient was last seen in primary care on 10/29/2023 by Jordan, [name], MD.  Called Nurse Triage reporting Shortness of Breath.  Symptoms began several months ago.  Interventions attempted: Rest, hydration, or home remedies.  Symptoms are: gradually worsening.  Triage Disposition: See HCP Within 4 Hours (Or PCP Triage)  Patient/caregiver understands and will follow disposition?: No, wishes to speak with PCP     Copied from CRM #[id number]. Topic: Clinical - Red Word Triage >> Feb 19, 2024  2:36 PM Rea ORN wrote: Red Word that prompted transfer to Nurse Triage: SOB Reason for Disposition  [1] MILD difficulty breathing (e.g., minimal/no SOB at rest, SOB with walking, pulse < 100) AND [2] NEW-onset or WORSE than normal  Answer Assessment - Initial Assessment Questions This RN, given limited information, recommended pt be examined in ED if having new or worsening SOB at rest, especially since having random facial flushing and other symptoms pt would not specify. Advised pt be examined in next few hours regardless, pt refusing disposition, pt requesting appt next week with PCP. Sending message to PCP office for call back to pt.   Per PAS, pt unwilling to tell PAS about medical issue stating that that's between me and my doctor, then states just put shortness of breath Speaking to pt I got a lot of things going on SOB and 2-3 other things I want to talk about Still walking 3 miles SOB when standing still more SOB than normal, no speaking in phrases then, no heart racing, just sometimes not every time Wife's Dr. Glade Hope 2-3 other items Want to get some bloodwork Had wife check lungs other day sounded all clear No chest pain Pulse ox 96-97% No blood clot in legs or lungs No more SOB with  More when laying down, feel sleep apnea type thing Maybe get  chest x-ray, put my mind at ease No fever but feel warm some times Face gets really beet red then goes away, get real warm feeling in my face, goes away in about an hour Probably wouldn't have called if knew it would take this long, I hate this Advised go to ED if SOB at rest especially if worsening Advised be examined in next few hours otherwise Pt requesting appt next week Advised he'd receive call back for appt Don't worry about it okay Someone will call me back?  Protocols used: Breathing Difficulty-A-AH

## 2024-02-19 NOTE — Telephone Encounter (Signed)
 Patient was scheduled to see Dr. Jordan next week on 02/27/24.

## 2024-02-27 ENCOUNTER — Ambulatory Visit: Admitting: Family Medicine

## 2024-02-27 ENCOUNTER — Encounter: Payer: Self-pay | Admitting: Family Medicine

## 2024-02-27 ENCOUNTER — Ambulatory Visit

## 2024-02-27 VITALS — BP 130/88 | HR 76 | Temp 97.9°F | Resp 16 | Ht 67.0 in | Wt 217.4 lb

## 2024-02-27 DIAGNOSIS — F419 Anxiety disorder, unspecified: Secondary | ICD-10-CM | POA: Insufficient documentation

## 2024-02-27 DIAGNOSIS — E66811 Obesity, class 1: Secondary | ICD-10-CM | POA: Diagnosis not present

## 2024-02-27 DIAGNOSIS — R0789 Other chest pain: Secondary | ICD-10-CM

## 2024-02-27 DIAGNOSIS — R197 Diarrhea, unspecified: Secondary | ICD-10-CM

## 2024-02-27 DIAGNOSIS — E6609 Other obesity due to excess calories: Secondary | ICD-10-CM | POA: Diagnosis not present

## 2024-02-27 DIAGNOSIS — R0602 Shortness of breath: Secondary | ICD-10-CM

## 2024-02-27 DIAGNOSIS — Z6833 Body mass index (BMI) 33.0-33.9, adult: Secondary | ICD-10-CM | POA: Diagnosis not present

## 2024-02-27 DIAGNOSIS — E559 Vitamin D deficiency, unspecified: Secondary | ICD-10-CM

## 2024-02-27 LAB — COMPREHENSIVE METABOLIC PANEL WITH GFR
ALT: 38 U/L (ref 3–53)
AST: 27 U/L (ref 5–37)
Albumin: 4.8 g/dL (ref 3.5–5.2)
Alkaline Phosphatase: 49 U/L (ref 39–117)
BUN: 23 mg/dL (ref 6–23)
CO2: 26 meq/L (ref 19–32)
Calcium: 9.5 mg/dL (ref 8.4–10.5)
Chloride: 104 meq/L (ref 96–112)
Creatinine, Ser: 1.14 mg/dL (ref 0.40–1.50)
GFR: 71.87 mL/min
Glucose, Bld: 94 mg/dL (ref 70–99)
Potassium: 3.8 meq/L (ref 3.5–5.1)
Sodium: 137 meq/L (ref 135–145)
Total Bilirubin: 1.5 mg/dL — ABNORMAL HIGH (ref 0.2–1.2)
Total Protein: 8 g/dL (ref 6.0–8.3)

## 2024-02-27 LAB — CBC
HCT: 43.9 % (ref 39.0–52.0)
Hemoglobin: 15.1 g/dL (ref 13.0–17.0)
MCHC: 34.4 g/dL (ref 30.0–36.0)
MCV: 86.9 fl (ref 78.0–100.0)
Platelets: 226 K/uL (ref 150.0–400.0)
RBC: 5.06 Mil/uL (ref 4.22–5.81)
RDW: 12.7 % (ref 11.5–15.5)
WBC: 7 K/uL (ref 4.0–10.5)

## 2024-02-27 LAB — VITAMIN D 25 HYDROXY (VIT D DEFICIENCY, FRACTURES): VITD: 36.12 ng/mL (ref 30.00–100.00)

## 2024-02-27 NOTE — Progress Notes (Signed)
 "  ACUTE VISIT Chief Complaint  Patient presents with   Shortness of Breath    Feels shortness of breath with weight gain while sitting and starts to feel warm / face becomes flushed and red - Sore under left arm. Pt wants a chest xray and labs    Discussed the use of AI scribe software for clinical note transcription with the patient, who gave verbal consent to proceed. History of Present Illness Cody [name] is a 59 year old male with PMHx significant for HLD, anxiety, chronic neck pain, vit D def, and BMI 34 here today concerned about SOB as described above.  He experiences shortness of breath at rest, despite being able to walk three miles briskly without difficulty.  No CP,palpitations,or diaphoresis associated. Coronary calcium score done in 12/2020 it was 0. Small 4 mm right middle lobe pulmonary nodule, nonspecific, but statistically likely benign. No follow-up needed if patient is low-risk. No hx of tobacco use. He would like another coronary calcium score done and CXR today. HLD, he is not on pharmacologic treatment, he prefers to hold on checking FLP for now.  Lab Results  Component Value Date   CHOL 178 11/16/2021   HDL 45.40 11/16/2021   LDLCALC 106 (H) 11/16/2021   TRIG 131.0 11/16/2021   CHOLHDL 4 11/16/2021    -He reports tenderness on the left lateral side of his chest and denies any recent injury. No cough or wheezing is present.He has not noted cough or wheezing. Pain is exacerbated by palpation and certain movements.   He has recently lost 10-12 pounds over the past two weeks after starting a carnivore diet. He is unsure if the weight loss is related to the diet or other factors.  He has a history of loose stools, which became more pronounced nine days ago, described as 'turning on a faucet' with complete watery stools. He has been taking Benefiber to manage this symptom. No abdominal pain is reported, but he has a lifelong history of loose stools and suspects  possible related with his diet.  Mild nausea, aggravated by certain foods. No melena or blood in stool. No sick contact or recent travel.  + Fatigue. He reports poor sleep quality, with his wife noting heavy snoring and periods of apnea. He has not undergone a sleep study. He monitors his sleep with a watch, which indicates variable sleep quality.  He acknowledges a history of anxiety, often fearing the worst about his health. He is not currently taking any medication for anxiety and prefers not to pursue medication or psychotherapy.  Review of Systems  Constitutional:  Negative for activity change, appetite change, chills and fever.  HENT:  Negative for mouth sores, sore throat and trouble swallowing.   Respiratory:  Negative for stridor.   Endocrine: Negative for polydipsia, polyphagia and polyuria.  Genitourinary:  Negative for decreased urine volume, dysuria and hematuria.  Musculoskeletal:  Negative for gait problem and joint swelling.  Skin:  Negative for rash.  Neurological:  Negative for syncope, facial asymmetry, weakness, numbness and headaches.  Psychiatric/Behavioral:  Negative for confusion and hallucinations.   See other pertinent positives and negatives in HPI.  Medications Ordered Prior to Encounter[1]  Past Medical History:  Diagnosis Date   Claustrophobia    Complication of anesthesia    dizzy, difficult time waking up   Hemorrhoid 01/2019   external   Medical history non-contributory    Allergies[2]  Social History   Socioeconomic History   Marital status: Married  Spouse name: Not on file   Number of children: Not on file   Years of education: Not on file   Highest education level: Not on file  Occupational History   Not on file  Tobacco Use   Smoking status: Never   Smokeless tobacco: Never  Vaping Use   Vaping status: Never Used  Substance and Sexual Activity   Alcohol use: Yes    Comment: occassional   Drug use: No   Sexual activity: Yes   Other Topics Concern   Not on file  Social History Narrative   Not on file   Social Drivers of Health   Tobacco Use: Low Risk (02/27/2024)   Patient History    Smoking Tobacco Use: Never    Smokeless Tobacco Use: Never    Passive Exposure: Not on file  Financial Resource Strain: Not on file  Food Insecurity: Not on file  Transportation Needs: Not on file  Physical Activity: Not on file  Stress: Not on file  Social Connections: Not on file  Depression (PHQ2-9): Low Risk (02/27/2024)   Depression (PHQ2-9)    PHQ-2 Score: 0  Alcohol Screen: Not on file  Housing: Not on file  Utilities: Not on file  Health Literacy: Not on file   Vitals:   02/27/24 1007  BP: 130/88  Pulse: 76  Resp: 16  Temp: 97.9 F (36.6 C)  SpO2: 96%   Body mass index is 34.05 kg/m. Wt Readings from Last 3 Encounters:  02/27/24 217 lb 6.4 oz (98.6 kg)  11/30/23 218 lb 4.1 oz (99 kg)  10/29/23 213 lb 12.8 oz (97 kg)   Physical Exam Vitals and nursing note reviewed.  Constitutional:      General: He is not in acute distress.    Appearance: He is well-developed.  HENT:     Head: Normocephalic and atraumatic.     Mouth/Throat:     Mouth: Mucous membranes are moist.     Pharynx: Oropharynx is clear.  Eyes:     Conjunctiva/sclera: Conjunctivae normal.  Cardiovascular:     Rate and Rhythm: Normal rate and regular rhythm.     Pulses:          Dorsalis pedis pulses are 2+ on the right side and 2+ on the left side.     Heart sounds: No murmur heard. Pulmonary:     Effort: Pulmonary effort is normal. No respiratory distress.     Breath sounds: Normal breath sounds.  Chest:     Chest wall: Tenderness present.    Abdominal:     Palpations: Abdomen is soft. There is no hepatomegaly or mass.     Tenderness: There is no abdominal tenderness.  Lymphadenopathy:     Cervical: No cervical adenopathy.     Upper Body:     Right upper body: No supraclavicular adenopathy.     Left upper body: No  supraclavicular adenopathy.  Skin:    General: Skin is warm.     Findings: No erythema or rash.  Neurological:     Mental Status: He is alert and oriented to person, place, and time.     Cranial Nerves: No cranial nerve deficit.     Gait: Gait normal.  Psychiatric:        Mood and Affect: Affect normal. Mood is anxious.   ASSESSMENT AND PLAN:  Mr. Salil Raineri was seen today for shortness of breath.  Diagnoses and all orders for this visit: Orders Placed This Encounter  Procedures  DG Chest 2 View   CT CARDIAC SCORING (SELF PAY ONLY)   Comprehensive metabolic panel with GFR   CBC   VITAMIN D  25 Hydroxy (Vit-D Deficiency, Fractures)   Lab Results  Component Value Date   WBC 7.0 02/27/2024   HGB 15.1 02/27/2024   HCT 43.9 02/27/2024   MCV 86.9 02/27/2024   PLT 226.0 02/27/2024   Lab Results  Component Value Date   NA 137 02/27/2024   CL 104 02/27/2024   K 3.8 02/27/2024   CO2 26 02/27/2024   BUN 23 02/27/2024   CREATININE 1.14 02/27/2024   GFR 71.87 02/27/2024   CALCIUM 9.5 02/27/2024   ALBUMIN 4.8 02/27/2024   GLUCOSE 94 02/27/2024   Lab Results  Component Value Date   ALT 38 02/27/2024   AST 27 02/27/2024   ALKPHOS 49 02/27/2024   BILITOT 1.5 (H) 02/27/2024   Lab Results  Component Value Date   VD25OH 36.12 02/27/2024   SOB (shortness of breath) We discussed possible etiologies. Problem is at rest, he does not have any problem when brisk walking outdoors or the treadmill. Explained that the probability of this being related to a serious cardiac process is low. Abdominal obesity can be a contributing factor, so wt loss may help. OSA is also to be considered, he is not interested in sleep study. Coronary calcium score ordered as requested. CXR will be obtained today, I reviewed and I do not see opacities or infiltrates suggestive of an acute process.  -     Comprehensive metabolic panel with GFR; Future -     CBC; Future -     DG Chest 2 View;  Future -     CT CARDIAC SCORING (SELF PAY ONLY); Future  Class 1 obesity due to excess calories with serious comorbidity and body mass index (BMI) of 33.0 to 33.9 in adult Assessment & Plan: This problem can be contributing to his SOB at rest. He understands understands the benefits of wt loss as well as adverse effects of obesity. Consistency with healthy diet and physical activity encouraged.  Vitamin D  deficiency Assessment & Plan: Continue current dose of OTC vit D supplementation. Further recommendations according to 25 OH vit D result.  Orders: -     VITAMIN D  25 Hydroxy (Vit-D Deficiency, Fractures); Future  Diarrhea, unspecified type  He reports long hx of loose stools, exacerbated by eating certain foods. ? IBS-D. S/P cholecystectomy. Colonoscopy 02/2019. I do not think imaging is needed at this time. Further recommendations according to lab results.  Anxiety disorder, unspecified type Assessment & Plan: He is not interested in pharmacologic treatment for now nor on CBT.  Musculoskeletal chest pain Pain on left lateral chest, between anterior and posterior axillary line. No skin changes or masses. Hx does not suggest a serious process. Monitor for changes.  Return if symptoms worsen or fail to improve, for keep next appointment.  Korben Carcione G. Martavious Hartel, MD  [facility]. [place] office.     [1]  Current Outpatient Medications on File Prior to Visit  Medication Sig Dispense Refill   gabapentin  (NEURONTIN ) 300 MG capsule Take 300 mg by mouth as needed.     Multiple Vitamin (MULTIVITAMIN ADULT PO) Take by mouth.     ondansetron  (ZOFRAN -ODT) 4 MG disintegrating tablet Dissolve 1 tablet (4 mg total) by mouth every 8 (eight) hours as needed for nausea or vomiting. 20 tablet 0   VITAMIN D  PO Take by mouth.     AMBULATORY NON FORMULARY  MEDICATION Medication Name: Diltiazem  Gel 2% Use Pea sized amount three times a day for 6-8 weeks to rectal area (Patient not  taking: Reported on 02/27/2024) 30 g 1   dicyclomine  (BENTYL ) 10 MG capsule Take 1 capsule (10 mg total) by mouth 3 (three) times daily before meals. (Patient not taking: Reported on 02/27/2024) 90 capsule 0   doxycycline  (VIBRA -TABS) 100 MG tablet Take 1 tablet (100 mg total) by mouth 2 (two) times daily. (Patient not taking: Reported on 02/27/2024) 14 tablet 0   HYDROcodone -acetaminophen  (NORCO) 10-325 MG tablet Take 1 tablet by mouth every 6 (six) hours as needed for moderate pain (pain score 4-6). (Patient not taking: Reported on 02/27/2024) 20 tablet 0   No current facility-administered medications on file prior to visit.  [2] No Known Allergies  "

## 2024-02-27 NOTE — Assessment & Plan Note (Signed)
 Continue current dose of OTC vit D supplementation. Further recommendations according to 25 OH vit D result.

## 2024-02-27 NOTE — Assessment & Plan Note (Signed)
 This problem can be contributing to his SOB at rest. He understands understands the benefits of wt loss as well as adverse effects of obesity. Consistency with healthy diet and physical activity encouraged.

## 2024-02-27 NOTE — Assessment & Plan Note (Signed)
 He is not interested in pharmacologic treatment for now nor on CBT.

## 2024-02-28 ENCOUNTER — Ambulatory Visit: Payer: Self-pay | Admitting: Family Medicine

## 2024-02-28 DIAGNOSIS — R17 Unspecified jaundice: Secondary | ICD-10-CM

## 2024-03-11 ENCOUNTER — Other Ambulatory Visit: Payer: Self-pay

## 2024-03-11 ENCOUNTER — Other Ambulatory Visit (HOSPITAL_BASED_OUTPATIENT_CLINIC_OR_DEPARTMENT_OTHER): Payer: Self-pay

## 2024-03-11 ENCOUNTER — Encounter: Payer: Self-pay | Admitting: Family Medicine

## 2024-03-11 MED ORDER — GABAPENTIN 300 MG PO CAPS
300.0000 mg | ORAL_CAPSULE | ORAL | 0 refills | Status: AC | PRN
  Filled 2024-03-11: qty 90, 90d supply, fill #0

## 2024-03-11 NOTE — Telephone Encounter (Signed)
 Rx filled

## 2024-03-12 ENCOUNTER — Ambulatory Visit: Admitting: Family Medicine

## 2024-03-13 ENCOUNTER — Ambulatory Visit (HOSPITAL_BASED_OUTPATIENT_CLINIC_OR_DEPARTMENT_OTHER)
Admission: RE | Admit: 2024-03-13 | Discharge: 2024-03-13 | Disposition: A | Discharge status: Home / Self Care | Payer: Self-pay | Source: Ambulatory Visit | Attending: Family Medicine | Admitting: Family Medicine

## 2024-03-13 DIAGNOSIS — R0602 Shortness of breath: Secondary | ICD-10-CM | POA: Insufficient documentation
